# Patient Record
Sex: Female | Born: 1990
Health system: Southern US, Community
[De-identification: ages and names within clinical notes are randomized; demographics above are authoritative.]

## PROBLEM LIST (undated history)

## (undated) DIAGNOSIS — S060X9A Concussion with loss of consciousness of unspecified duration, initial encounter: Secondary | ICD-10-CM

## (undated) DIAGNOSIS — F988 Other specified behavioral and emotional disorders with onset usually occurring in childhood and adolescence: Secondary | ICD-10-CM

## (undated) HISTORY — DX: Other specified behavioral and emotional disorders with onset usually occurring in childhood and adolescence: F98.8

## (undated) HISTORY — PX: TARSAL TUNNEL RELEASE: SHX5042

---
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
## 2014-01-29 HISTORY — PX: FOOT SURGERY: SHX648

## 2014-01-29 HISTORY — PX: ANKLE SURGERY: SHX546

## 2014-06-24 ENCOUNTER — Telehealth: Payer: Self-pay | Admitting: *Deleted

## 2014-06-24 NOTE — Telephone Encounter (Signed)
Pt mother Britta Mccreedy would dr todd to accept her daughter as new pt. Can I sch?

## 2014-06-24 NOTE — Telephone Encounter (Signed)
Okay per Dr Tawanna Cooler.  Please remind patient that he will be retiring in March.

## 2014-06-25 NOTE — Telephone Encounter (Signed)
Pt aware and appt has been scheduled

## 2014-07-15 ENCOUNTER — Ambulatory Visit (INDEPENDENT_AMBULATORY_CARE_PROVIDER_SITE_OTHER): Payer: 59 | Admitting: Family Medicine

## 2014-07-15 ENCOUNTER — Other Ambulatory Visit: Payer: Self-pay | Admitting: *Deleted

## 2014-07-15 ENCOUNTER — Encounter: Payer: Self-pay | Admitting: Family Medicine

## 2014-07-15 VITALS — BP 120/80 | Temp 98.7°F | Ht 68.0 in | Wt 154.0 lb

## 2014-07-15 DIAGNOSIS — F900 Attention-deficit hyperactivity disorder, predominantly inattentive type: Secondary | ICD-10-CM

## 2014-07-15 DIAGNOSIS — F988 Other specified behavioral and emotional disorders with onset usually occurring in childhood and adolescence: Secondary | ICD-10-CM | POA: Insufficient documentation

## 2014-07-15 MED ORDER — LISDEXAMFETAMINE DIMESYLATE 20 MG PO CAPS: 20.0000 mg | ORAL_CAPSULE | Freq: Every day | ORAL | Status: DC

## 2014-07-15 NOTE — Patient Instructions (Signed)
Findings 20 mg........Marland Kitchen. 1 daily in the morning  Return in 3-4 weeks for followup

## 2014-07-15 NOTE — Progress Notes (Signed)
   Subjective:    Patient ID: Carol Smith, female    DOB: 11-12-1990, 23 y.o.   MRN: [id number]  HPI Carol Smith is a 23 year old single female nonsmoker who comes in today as an inpatient   I have seen in taking care of her mom and dad in the past  She was first diagnosed to have ADD when she was 23 years of age by her pediatrician. She underwent a number Dison Costen-Kerrison psychologic testing. She was seen and followed through her younger in teenagers by pediatrics. 3 years ago she a stat pediatrics and stopped her medication. Since that time she's been going to PT cc. She hopes to transfer to the see you.  She's never been hospitalized she has no major illnesses or injuries none allergies and she takes her medication on a daily basis. Except for minocycline 100 twice a day for acne. Birth control none indicated she states she's not sexually active   Review of Systems    review of systems otherwise negative Objective:   Physical Exam Well-developed well-nourished female no acute distress vital signs stable she is afebrile       Assessment & Plan:  Adult ADD without hyperactivity.......... trial vyvanse

## 2014-08-09 ENCOUNTER — Ambulatory Visit (INDEPENDENT_AMBULATORY_CARE_PROVIDER_SITE_OTHER): Payer: 59 | Admitting: Family Medicine

## 2014-08-09 ENCOUNTER — Encounter: Payer: Self-pay | Admitting: Family Medicine

## 2014-08-09 VITALS — BP 110/80 | Temp 98.5°F | Wt 150.5 lb

## 2014-08-09 DIAGNOSIS — F900 Attention-deficit hyperactivity disorder, predominantly inattentive type: Secondary | ICD-10-CM

## 2014-08-09 DIAGNOSIS — F988 Other specified behavioral and emotional disorders with onset usually occurring in childhood and adolescence: Secondary | ICD-10-CM

## 2014-08-09 MED ORDER — METHYLPHENIDATE HCL 5 MG PO TABS: ORAL_TABLET | ORAL | Status: DC

## 2014-08-09 MED ORDER — METHYLPHENIDATE HCL 5 MG PO TABS: 5.0000 mg | ORAL_TABLET | Freq: Two times a day (BID) | ORAL | Status: DC

## 2014-08-09 MED ORDER — LISDEXAMFETAMINE DIMESYLATE 20 MG PO CAPS: 20.0000 mg | ORAL_CAPSULE | Freq: Every day | ORAL | Status: DC

## 2014-08-09 NOTE — Patient Instructions (Signed)
Vyvanse 20 mg........... One in the morning as you're currently doing  Ritalin 5 mg..........Marland Kitchen. 1 at 5 PM when necessary  Call 2 weeks from being out of your third prescription for refills

## 2014-08-09 NOTE — Progress Notes (Signed)
Pre visit review using our clinic review tool, if applicable. No additional management support is needed unless otherwise documented below in the visit note. 

## 2014-08-09 NOTE — Progress Notes (Signed)
   Subjective:    Patient ID: Carol Smith, female    DOB: July 25, 1991, 23 y.o.   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 She takes at 9 AM and last about 6 PM. She typically has a lot of homework to do at night and would like to talk about increasing the dose of the morning medication. However I suggested we use 5 mg supplement when necessary   Review of Systems Review of systems negative side effects from medication    Objective:   Physical Exam  Well-developed well-nourished female no acute distress vital signs stable she is afebrile      Assessment & Plan:  Adult ADD,,,,,,,,,, continue the Vyvanse 20 mg daily,,,,,,,,, add regular Ritalin 5 mg at 6 PM when necessary

## 2015-05-17 ENCOUNTER — Telehealth: Payer: Self-pay | Admitting: Family Medicine

## 2015-05-17 NOTE — Telephone Encounter (Signed)
[facility] - Client TELEPHONE ADVICE RECORD [facility] Patient Name: Carol Smith DOB: October 23, 1990 Initial Comment caller states she has an earache Nurse Assessment Nurse: Lane Hacker, RN, Windy Date/Time Lamount Cohen Time): 05/17/2015 12:20:26 PM Confirm and document reason for call. If symptomatic, describe symptoms. ---Caller states that she has had bilateral earache pain since last Wednesday, and worse each day. Rates pain now at 4/10 after DayQuil. She also has cold s/s with this - stuffy nose and mild dry cough. Had a fever, but not now. Has the patient traveled out of the country within the last 30 days? ---Not Applicable Does the patient require triage? ---Yes Related visit to physician within the last 2 weeks? ---No Does the PT have any chronic conditions? (i.e. diabetes, asthma, etc.) ---No Did the patient indicate they were pregnant? ---No Guidelines Guideline Title Affirmed Question Affirmed Notes Earache Earache (Exceptions: brief ear pain of < 60 minutes duration, earache occurring during air travel Final Disposition User See Physician within [address] Hours [place], [place], [place] Comments Dr. Tawanna Cooler is OOT. RN made appt for tomorrow at 10 am with Dr. Marshia Ly. Caller aware. Referrals REFERRED TO PCP OFFICE Disagree/Comply: Comply

## 2015-05-18 ENCOUNTER — Encounter: Payer: Self-pay | Admitting: Internal Medicine

## 2015-05-18 ENCOUNTER — Ambulatory Visit (INDEPENDENT_AMBULATORY_CARE_PROVIDER_SITE_OTHER): Payer: 59 | Admitting: Internal Medicine

## 2015-05-18 VITALS — BP 100/70 | HR 88 | Temp 99.2°F | Resp 18 | Ht 68.0 in | Wt 136.0 lb

## 2015-05-18 DIAGNOSIS — J069 Acute upper respiratory infection, unspecified: Secondary | ICD-10-CM

## 2015-05-18 NOTE — Progress Notes (Signed)
Pre visit review using our clinic review tool, if applicable. No additional management support is needed unless otherwise documented below in the visit note. 

## 2015-05-18 NOTE — Progress Notes (Signed)
Subjective:    Patient ID: Carol Smith, female    DOB: [birth year], 24 y.o.   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  For the past week she has had some bilateral ear discomfort and sinus congestion.  Over the past 4-5 days.  She is about some cold symptoms with cough, mild sore throat, low-grade fever.  She has been using DayQuil.  No past medical history on file.  Social History   Social History  . Marital Status: Single    Spouse Name: N/A  . Number of Children: N/A  . Years of Education: N/A   Occupational History  . Not on file.   Social History Main Topics  . Smoking status: Never Smoker   . Smokeless tobacco: Not on file  . Alcohol Use: Yes  . Drug Use: Not on file  . Sexual Activity: Not on file   Other Topics Concern  . Not on file   Social History Narrative    Past Surgical History  Procedure Laterality Date  . Foot surgery  01/2014  . Ankle surgery  01/2014  . Tarsal tunnel release Right     Family History  Problem Relation Age of Onset  . Cancer Other   . Hypertension Other     No Known Allergies  Current Outpatient Prescriptions on File Prior to Visit  Medication Sig Dispense Refill  . lisdexamfetamine (VYVANSE) 20 MG capsule Take 1 capsule (20 mg total) by mouth daily. 30 capsule 0  . lisdexamfetamine (VYVANSE) 20 MG capsule Take 1 capsule (20 mg total) by mouth daily. 30 capsule 0  . lisdexamfetamine (VYVANSE) 20 MG capsule Take 1 capsule (20 mg total) by mouth daily. 30 capsule 0  . methylphenidate (RITALIN) 5 MG tablet 1 at 5 PM 30 tablet 0  . methylphenidate (RITALIN) 5 MG tablet Take 1 tablet (5 mg total) by mouth 2 (two) times daily. 30 tablet 0  . methylphenidate (RITALIN) 5 MG tablet Take 1 tablet (5 mg total) by mouth 2 (two) times daily. 30 tablet 0   No current facility-administered medications on file prior to visit.    BP 100/70 mmHg  Pulse 88  Temp(Src) 99.2 F (37.3 C) (Oral)  Resp 18  Ht 5\' 8"   (1.727 m)  Wt 136 lb (61.689 kg)  BMI 20.68 kg/m2  SpO2 98%     Review of Systems  Constitutional: Positive for fever, activity change, appetite change and fatigue.  HENT: Positive for congestion, ear pain and sinus pressure. Negative for dental problem, ear discharge, hearing loss, rhinorrhea, sore throat and tinnitus.   Eyes: Negative for pain, discharge and visual disturbance.  Respiratory: Positive for cough. Negative for shortness of breath.   Cardiovascular: Negative for chest pain, palpitations and leg swelling.  Gastrointestinal: Negative for nausea, vomiting, abdominal pain, diarrhea, constipation, blood in stool and abdominal distention.  Genitourinary: Negative for dysuria, urgency, frequency, hematuria, flank pain, vaginal bleeding, vaginal discharge, difficulty urinating, vaginal pain and pelvic pain.  Musculoskeletal: Negative for joint swelling, arthralgias and gait problem.  Skin: Negative for rash.  Neurological: Negative for dizziness, syncope, speech difficulty, weakness, numbness and headaches.  Hematological: Negative for adenopathy.  Psychiatric/Behavioral: Negative for behavioral problems, dysphoric mood and agitation. The patient is not nervous/anxious.        Objective:   Physical Exam  Constitutional: She is oriented to person, place, and time. She appears well-developed and well-nourished.  HENT:  Head: Normocephalic.  Right Ear: External  ear normal.  Left Ear: External ear normal.  Mild erythema of the oropharynx Both tympanic membranes were normal  Eyes: Conjunctivae and EOM are normal. Pupils are equal, round, and reactive to light.  Neck: Normal range of motion. Neck supple. No thyromegaly present.  Cardiovascular: Normal rate, regular rhythm, normal heart sounds and intact distal pulses.   Pulmonary/Chest: Effort normal and breath sounds normal.  Abdominal: Soft. Bowel sounds are normal. She exhibits no mass. There is no tenderness.    Musculoskeletal: Normal range of motion.  Lymphadenopathy:    She has no cervical adenopathy.  Neurological: She is alert and oriented to person, place, and time.  Skin: Skin is warm and dry. No rash noted.  Psychiatric: She has a normal mood and affect. Her behavior is normal.          Assessment & Plan:   Viral URI.  Will treat symptomatically.  Patient will report any new or worsening symptoms ADHD stable

## 2015-05-18 NOTE — Patient Instructions (Signed)
Use saline irrigation, warm  moist compresses and over-the-counter decongestants only as directed.  Call if there is no improvement in 5 to 7 days, or sooner if you develop increasing pain, fever, or any new symptoms.  HOME CARE INSTRUCTIONS  Drink plenty of water. Water helps thin the mucus so your sinuses can drain more easily.  Use a humidifier.  Inhale steam 3 to 4 times a day (for example, sit in the bathroom with the shower running).  Apply a warm, moist washcloth to your face 3 to 4 times a day, or as directed by your health care provider.  Use saline nasal sprays to help moisten and clean your sinuses.

## 2015-05-26 ENCOUNTER — Ambulatory Visit (INDEPENDENT_AMBULATORY_CARE_PROVIDER_SITE_OTHER): Payer: 59 | Admitting: Family Medicine

## 2015-05-26 ENCOUNTER — Encounter: Payer: Self-pay | Admitting: Family Medicine

## 2015-05-26 VITALS — BP 108/80 | HR 93 | Temp 98.6°F | Wt 135.0 lb

## 2015-05-26 DIAGNOSIS — H9202 Otalgia, left ear: Secondary | ICD-10-CM

## 2015-05-26 DIAGNOSIS — M2662 Arthralgia of temporomandibular joint: Secondary | ICD-10-CM | POA: Diagnosis not present

## 2015-05-26 DIAGNOSIS — M26629 Arthralgia of temporomandibular joint, unspecified side: Secondary | ICD-10-CM

## 2015-05-26 NOTE — Patient Instructions (Signed)
Actual ear drum looks great I think TMJD is likely cause of your pain Treat with aleve 1 pill twice a day for 7-10 days.  If no better in 2 weeks, return to see [place]

## 2015-05-26 NOTE — Progress Notes (Signed)
Tana Conch, MD  Subjective:  Carol Smith is a 24 y.o. year old very pleasant female patient who presents with:  Left ear pain -see about a week ago for viral URI as well as bilateral ear pain. Cold has slowly improved. Ear pain in right resolved, left ear pain has persisted. 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 Has tried a warm cloth which helps some. Pain is worse with chewing or if talking prolonged period. Known TMJD history- states was considered for mouth guard in past. No antiinflammatories have been tried.   ROS- no fever, chills, nausea, vomiting. Mild sore throat and drainage persist.   Past Medical History- ADD  Medications- reviewed and updated, vyvanse and ritalin per Dr. Tawanna Cooler  Objective: BP 108/80 mmHg  Pulse 93  Temp(Src) 98.6 F (37 C)  Wt 135 lb (61.236 kg) Gen: NAD, resting comfortably, well appearing TM normal bilaterally, pharynx very mild erythema, no tonsilar enlargement or exudate With jaw motion feel click bilaterally at TMJ- worsens pain CV: RRR no murmurs rubs or gallops Lungs: CTAB no crackles, wheeze, rhonchi Ext: no edema Skin: warm, dry Neuro: grossly normal, moves all extremities  Assessment/Plan:  TMJ arthralgia as likely cause of Left ear pain. URI improving. Left ear pain persists- worse with chewing or talking and palpable click on exam. We will trial aleve BID for 7-10 days. If persistent symptoms not improving, return in 2 weeks. Would consider ENT though suspect patient will improve.   Return precautions advised.

## 2015-10-11 ENCOUNTER — Encounter: Payer: Self-pay | Admitting: Family Medicine

## 2015-10-11 ENCOUNTER — Ambulatory Visit: Payer: 59 | Admitting: Family Medicine

## 2015-10-11 ENCOUNTER — Ambulatory Visit (INDEPENDENT_AMBULATORY_CARE_PROVIDER_SITE_OTHER): Payer: 59 | Admitting: Family Medicine

## 2015-10-11 VITALS — BP 110/70 | Temp 98.7°F | Wt 145.0 lb

## 2015-10-11 DIAGNOSIS — M79605 Pain in left leg: Secondary | ICD-10-CM

## 2015-10-11 DIAGNOSIS — M545 Low back pain, unspecified: Secondary | ICD-10-CM

## 2015-10-11 MED ORDER — TRAMADOL HCL 50 MG PO TABS: ORAL_TABLET | ORAL | Status: DC

## 2015-10-11 MED ORDER — CYCLOBENZAPRINE HCL 5 MG PO TABS: ORAL_TABLET | ORAL | Status: DC

## 2015-10-11 NOTE — Progress Notes (Signed)
Pre visit review using our clinic review tool, if applicable. No additional management support is needed unless otherwise documented below in the visit note. 

## 2015-10-11 NOTE — Patient Instructions (Signed)
Tramadol 50 mg............ one twice daily  Flexeril 5 mg.........Marland Kitchen. 1 at bedtime  Avoid sitting  We will set you up a physical therapy for evaluation and treatment program to relieve your back pain  Return when necessary
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
## 2016-04-18 ENCOUNTER — Encounter (HOSPITAL_COMMUNITY): Payer: Self-pay | Admitting: *Deleted

## 2016-04-18 ENCOUNTER — Emergency Department (HOSPITAL_COMMUNITY): Payer: BLUE CROSS/BLUE SHIELD

## 2016-04-18 ENCOUNTER — Emergency Department (HOSPITAL_COMMUNITY)
Admission: EM | Admit: 2016-04-18 | Discharge: 2016-04-18 | Disposition: A | Discharge status: Home / Self Care | Payer: BLUE CROSS/BLUE SHIELD | Attending: Emergency Medicine | Admitting: Emergency Medicine

## 2016-04-18 ENCOUNTER — Telehealth: Payer: Self-pay | Admitting: Family Medicine

## 2016-04-18 DIAGNOSIS — R55 Syncope and collapse: Secondary | ICD-10-CM | POA: Diagnosis not present

## 2016-04-18 DIAGNOSIS — M79671 Pain in right foot: Secondary | ICD-10-CM | POA: Diagnosis not present

## 2016-04-18 DIAGNOSIS — Y999 Unspecified external cause status: Secondary | ICD-10-CM | POA: Diagnosis not present

## 2016-04-18 DIAGNOSIS — Y939 Activity, unspecified: Secondary | ICD-10-CM | POA: Insufficient documentation

## 2016-04-18 DIAGNOSIS — N3000 Acute cystitis without hematuria: Secondary | ICD-10-CM | POA: Diagnosis not present

## 2016-04-18 DIAGNOSIS — S0990XA Unspecified injury of head, initial encounter: Secondary | ICD-10-CM | POA: Insufficient documentation

## 2016-04-18 DIAGNOSIS — Y929 Unspecified place or not applicable: Secondary | ICD-10-CM | POA: Diagnosis not present

## 2016-04-18 DIAGNOSIS — M25571 Pain in right ankle and joints of right foot: Secondary | ICD-10-CM | POA: Diagnosis not present

## 2016-04-18 DIAGNOSIS — Z791 Long term (current) use of non-steroidal anti-inflammatories (NSAID): Secondary | ICD-10-CM | POA: Insufficient documentation

## 2016-04-18 DIAGNOSIS — W1800XA Striking against unspecified object with subsequent fall, initial encounter: Secondary | ICD-10-CM | POA: Diagnosis not present

## 2016-04-18 DIAGNOSIS — S99911A Unspecified injury of right ankle, initial encounter: Secondary | ICD-10-CM | POA: Diagnosis not present

## 2016-04-18 DIAGNOSIS — S99921A Unspecified injury of right foot, initial encounter: Secondary | ICD-10-CM | POA: Diagnosis not present

## 2016-04-18 DIAGNOSIS — W19XXXA Unspecified fall, initial encounter: Secondary | ICD-10-CM

## 2016-04-18 DIAGNOSIS — Z79899 Other long term (current) drug therapy: Secondary | ICD-10-CM | POA: Diagnosis not present

## 2016-04-18 DIAGNOSIS — R52 Pain, unspecified: Secondary | ICD-10-CM

## 2016-04-18 LAB — CBC WITH DIFFERENTIAL/PLATELET
BASOS ABS: 0 10*3/uL (ref 0.0–0.1)
Basophils Relative: 0 %
EOS ABS: 0 10*3/uL (ref 0.0–0.7)
EOS PCT: 0 %
HCT: 37.7 % (ref 36.0–46.0)
HEMOGLOBIN: 12.8 g/dL (ref 12.0–15.0)
LYMPHS ABS: 2 10*3/uL (ref 0.7–4.0)
LYMPHS PCT: 32 %
MCH: 31.8 pg (ref 26.0–34.0)
MCHC: 34 g/dL (ref 30.0–36.0)
MCV: 93.8 fL (ref 78.0–100.0)
Monocytes Absolute: 0.7 10*3/uL (ref 0.1–1.0)
Monocytes Relative: 11 %
NEUTROS PCT: 57 %
Neutro Abs: 3.6 10*3/uL (ref 1.7–7.7)
PLATELETS: 243 10*3/uL (ref 150–400)
RBC: 4.02 MIL/uL (ref 3.87–5.11)
RDW: 12.9 % (ref 11.5–15.5)
WBC: 6.3 10*3/uL (ref 4.0–10.5)

## 2016-04-18 LAB — URINALYSIS, ROUTINE W REFLEX MICROSCOPIC
Bilirubin Urine: NEGATIVE
Glucose, UA: NEGATIVE mg/dL
Ketones, ur: >80 mg/dL — AB
Leukocytes, UA: NEGATIVE
NITRITE: NEGATIVE
PROTEIN: NEGATIVE mg/dL
SPECIFIC GRAVITY, URINE: 1.033 — AB (ref 1.005–1.030)
pH: 6 (ref 5.0–8.0)

## 2016-04-18 LAB — COMPREHENSIVE METABOLIC PANEL
ALT: 20 U/L (ref 14–54)
ANION GAP: 7 (ref 5–15)
AST: 27 U/L (ref 15–41)
Albumin: 4.7 g/dL (ref 3.5–5.0)
Alkaline Phosphatase: 40 U/L (ref 38–126)
BUN: 10 mg/dL (ref 6–20)
CHLORIDE: 106 mmol/L (ref 101–111)
CO2: 25 mmol/L (ref 22–32)
CREATININE: 0.82 mg/dL (ref 0.44–1.00)
Calcium: 9.3 mg/dL (ref 8.9–10.3)
Glucose, Bld: 98 mg/dL (ref 65–99)
POTASSIUM: 3.8 mmol/L (ref 3.5–5.1)
SODIUM: 138 mmol/L (ref 135–145)
Total Bilirubin: 1.2 mg/dL (ref 0.3–1.2)
Total Protein: 7.1 g/dL (ref 6.5–8.1)

## 2016-04-18 LAB — URINE MICROSCOPIC-ADD ON

## 2016-04-18 LAB — RAPID URINE DRUG SCREEN, HOSP PERFORMED
Amphetamines: NOT DETECTED
BARBITURATES: NOT DETECTED
Benzodiazepines: NOT DETECTED
Cocaine: NOT DETECTED
Opiates: NOT DETECTED
Tetrahydrocannabinol: NOT DETECTED

## 2016-04-18 LAB — I-STAT BETA HCG BLOOD, ED (MC, WL, AP ONLY)

## 2016-04-18 LAB — I-STAT TROPONIN, ED: TROPONIN I, POC: 0 ng/mL (ref 0.00–0.08)

## 2016-04-18 LAB — CBG MONITORING, ED: GLUCOSE-CAPILLARY: 84 mg/dL (ref 65–99)

## 2016-04-18 LAB — ETHANOL

## 2016-04-18 MED ORDER — CEPHALEXIN 500 MG PO CAPS: 500.0000 mg | ORAL_CAPSULE | Freq: Four times a day (QID) | ORAL | Status: DC

## 2016-04-18 MED ORDER — IBUPROFEN 200 MG PO TABS
600.0000 mg | ORAL_TABLET | Freq: Once | ORAL | Status: AC
  Administered 2016-04-18: 600 mg via ORAL
  Filled 2016-04-18: qty 3

## 2016-04-18 MED ORDER — ONDANSETRON HCL 4 MG/2ML IJ SOLN
4.0000 mg | Freq: Once | INTRAMUSCULAR | Status: AC
  Administered 2016-04-18: 4 mg via INTRAVENOUS
  Filled 2016-04-18: qty 2

## 2016-04-18 MED ORDER — NAPROXEN 500 MG PO TABS: 500.0000 mg | ORAL_TABLET | Freq: Two times a day (BID) | ORAL | Status: DC

## 2016-04-18 MED ORDER — SODIUM CHLORIDE 0.9 % IV BOLUS (SEPSIS)
1000.0000 mL | Freq: Once | INTRAVENOUS | Status: AC
  Administered 2016-04-18: 1000 mL via INTRAVENOUS

## 2016-04-18 MED ORDER — SODIUM CHLORIDE 0.9 % IV BOLUS (SEPSIS): 1000.0000 mL | Freq: Once | INTRAVENOUS | Status: DC

## 2016-04-18 MED ORDER — ONDANSETRON 4 MG PO TBDP: 4.0000 mg | ORAL_TABLET | Freq: Three times a day (TID) | ORAL | Status: DC | PRN

## 2016-04-18 NOTE — ED Notes (Signed)
Vonna KotykJay from lab, error in micro  Mucus present 6-30 RBC 0-5 WBC 0-5 Epithelial Rare bacteria  Primary nurse made aware of same.

## 2016-04-18 NOTE — Telephone Encounter (Signed)
Patient checked into ED 

## 2016-04-18 NOTE — Telephone Encounter (Signed)
[facility] - Client TELEPHONE ADVICE RECORD [facility] Patient Name: Carol Smith DOB: [birth year] Initial Comment caller states daughter fell and hit her head last night, doesn't remember any of it, fatigued and nauseated Nurse Assessment Nurse: Debera Latalston, RN, Tinnie GensJeffrey Date/Time Lamount Cohen(Eastern Time): 04/18/2016 11:19:12 AM Confirm and document reason for call. If symptomatic, describe symptoms. You must click the next button to save text entered. ---Caller states daughter fell and hit her head last night, doesn't remember any of it, fatigued and nauseated. Has the patient traveled out of the country within the last 30 days? ---No Does the patient have any new or worsening symptoms? ---Yes Will a triage be completed? ---Yes Related visit to physician within the last 2 weeks? ---N/A Does the PT have any chronic conditions? (i.e. diabetes, asthma, etc.) ---No Is the patient pregnant or possibly pregnant? (Ask all females between the ages of 1512-55) ---No Is this a behavioral health or substance abuse call? ---No Guidelines Guideline Title Affirmed Question Affirmed Notes Head Injury Can't remember what happened (amnesia) Final Disposition User Go to ED Now Debera Latalston, RN, Tinnie GensJeffrey Referrals GO TO FACILITY UNDECIDED Disagree/Comply: Danella Maiersomply

## 2016-04-18 NOTE — ED Notes (Signed)
Pt was put on a bedpan but was unable to provide a urine sample at this time.  Will ask again later.

## 2016-04-18 NOTE — Discharge Instructions (Signed)
You have been seen today for a fall and a head injury. Your imaging showed no acute abnormalities.   1. Head injury: Follow up with PCP in a week for reassessment to ensure proper symptom resolution. Return to ED should symptoms worsen. Refer to the attached literature for more specific information, to include expected symptoms as well as symptoms that would require you to return to the ED. May use ibuprofen, naproxen, or Tylenol for pain. Close observation is indicated by a responsible friend or family member for 24 hours from the injury. 2. UTI: There were signs of a urinary tract infection on the urinalysis. Please take all of your antibiotics until finished!   You may develop abdominal discomfort or diarrhea from the antibiotic.  You may help offset this with probiotics which you can buy or get in yogurt. Do not eat or take the probiotics until 2 hours after your antibiotic.

## 2016-04-18 NOTE — ED Notes (Signed)
Pt not in the room at this time.  PA talking to family member.
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
## 2016-04-18 NOTE — ED Notes (Signed)
Pt in CT.

## 2016-04-18 NOTE — ED Notes (Signed)
Sprite given for fluid challenge.  PA-C in to room.

## 2016-04-18 NOTE — ED Notes (Signed)
During ambulation pt complains of being lightheaded and sore.

## 2016-04-18 NOTE — ED Notes (Signed)
Pt's mother reports pt received a call early this am around 0200 about pt falling and hitting her forehead.  Pt reports she was in an elevator, thinks her R foot got caught in the "elevator gap" and fell-face forward hitting her forehead on a tile floor.  Pt does not recall what happened after the fall, reports possible LOC.  Pt reports pain in her forehead and her R foot.  +weight bearing to R foot.

## 2016-04-26 ENCOUNTER — Encounter: Payer: Self-pay | Admitting: Family Medicine

## 2016-04-26 ENCOUNTER — Ambulatory Visit (INDEPENDENT_AMBULATORY_CARE_PROVIDER_SITE_OTHER): Payer: BLUE CROSS/BLUE SHIELD | Admitting: Family Medicine

## 2016-04-26 VITALS — BP 100/72 | HR 95 | Temp 98.7°F | Ht 68.0 in | Wt 139.3 lb

## 2016-04-26 DIAGNOSIS — S060X9A Concussion with loss of consciousness of unspecified duration, initial encounter: Secondary | ICD-10-CM

## 2016-04-26 DIAGNOSIS — Z789 Other specified health status: Secondary | ICD-10-CM | POA: Diagnosis not present

## 2016-04-26 DIAGNOSIS — W19XXXA Unspecified fall, initial encounter: Secondary | ICD-10-CM | POA: Diagnosis not present

## 2016-04-26 DIAGNOSIS — N390 Urinary tract infection, site not specified: Secondary | ICD-10-CM | POA: Diagnosis not present

## 2016-04-26 DIAGNOSIS — Z7289 Other problems related to lifestyle: Secondary | ICD-10-CM

## 2016-04-26 NOTE — Patient Instructions (Signed)
Schedule a physical exam with your doctor.   Concussion, Adult A concussion, or closed-head injury, is a brain injury caused by a direct blow to the head or by a quick and sudden movement (jolt) of the head or neck. Concussions are usually not life-threatening. Even so, the effects of a concussion can be serious. If you have had a concussion before, you are more likely to experience concussion-like symptoms after a direct blow to the head.  CAUSES  Direct blow to the head, such as from running into another player during a soccer game, being hit in a fight, or hitting your head on a hard surface.  A jolt of the head or neck that causes the brain to move back and forth inside the skull, such as in a car crash. SIGNS AND SYMPTOMS The signs of a concussion can be hard to notice. Early on, they may be missed by you, family members, and health care providers. You may look fine but act or feel differently. Symptoms are usually temporary, but they may last for days, weeks, or even longer. Some symptoms may appear right away while others may not show up for hours or days. Every head injury is different. Symptoms include:  Mild to moderate headaches that will not go away.  A feeling of pressure inside your head.  Having more trouble than usual:  Learning or remembering things you have heard.  Answering questions.  Paying attention or concentrating.  Organizing daily tasks.  Making decisions and solving problems.  Slowness in thinking, acting or reacting, speaking, or reading.  Getting lost or being easily confused.  Feeling tired all the time or lacking energy (fatigued).  Feeling drowsy.  Sleep disturbances.  Sleeping more than usual.  Sleeping less than usual.  Trouble falling asleep.  Trouble sleeping (insomnia).  Loss of balance or feeling lightheaded or dizzy.  Nausea or vomiting.  Numbness or tingling.  Increased sensitivity  to:  Sounds.  Lights.  Distractions.  Vision problems or eyes that tire easily.  Diminished sense of taste or smell.  Ringing in the ears.  Mood changes such as feeling sad or anxious.  Becoming easily irritated or angry for little or no reason.  Lack of motivation.  Seeing or hearing things other people do not see or hear (hallucinations). DIAGNOSIS Your health care provider can usually diagnose a concussion based on a description of your injury and symptoms. He or she will ask whether you passed out (lost consciousness) and whether you are having trouble remembering events that happened right before and during your injury. Your evaluation might include:  A brain scan to look for signs of injury to the brain. Even if the test shows no injury, you may still have a concussion.  Blood tests to be sure other problems are not present. TREATMENT  Concussions are usually treated in an emergency department, in urgent care, or at a clinic. You may need to stay in the hospital overnight for further treatment.  Tell your health care provider if you are taking any medicines, including prescription medicines, over-the-counter medicines, and natural remedies. Some medicines, such as blood thinners (anticoagulants) and aspirin, may increase the chance of complications. Also tell your health care provider whether you have had alcohol or are taking illegal drugs. This information may affect treatment.  Your health care provider will send you home with important instructions to follow.  How fast you will recover from a concussion depends on many factors. These factors include how severe your  concussion is, what part of your brain was injured, your age, and how healthy you were before the concussion.  Most people with mild injuries recover fully. Recovery can take time. In general, recovery is slower in older persons. Also, persons who have had a concussion in the past or have other medical  problems may find that it takes longer to recover from their current injury. HOME CARE INSTRUCTIONS General Instructions  Carefully follow the directions your health care provider gave you.  Only take over-the-counter or prescription medicines for pain, discomfort, or fever as directed by your health care provider.  Take only those medicines that your health care provider has approved.  Do not drink alcohol until your health care provider says you are well enough to do so. Alcohol and certain other drugs may slow your recovery and can put you at risk of further injury.  If it is harder than usual to remember things, write them down.  If you are easily distracted, try to do one thing at a time. For example, do not try to watch TV while fixing dinner.  Talk with family members or close friends when making important decisions.  Keep all follow-up appointments. Repeated evaluation of your symptoms is recommended for your recovery.  Watch your symptoms and tell others to do the same. Complications sometimes occur after a concussion. Older adults with a brain injury may have a higher risk of serious complications, such as a blood clot on the brain.  Tell your teachers, school nurse, school counselor, coach, athletic trainer, or work Freight forwarder about your injury, symptoms, and restrictions. Tell them about what you can or cannot do. They should watch for:  Increased problems with attention or concentration.  Increased difficulty remembering or learning new information.  Increased time needed to complete tasks or assignments.  Increased irritability or decreased ability to cope with stress.  Increased symptoms.  Rest. Rest helps the brain to heal. Make sure you:  Get plenty of sleep at night. Avoid staying up late at night.  Keep the same bedtime hours on weekends and weekdays.  Rest during the day. Take daytime naps or rest breaks when you feel tired.  Limit activities that require a  lot of thought or concentration. These include:  Doing homework or job-related work.  Watching TV.  Working on the computer.  Avoid any situation where there is potential for another head injury (football, hockey, soccer, basketball, martial arts, downhill snow sports and horseback riding). Your condition will get worse every time you experience a concussion. You should avoid these activities until you are evaluated by the appropriate follow-up health care providers. Returning To Your Regular Activities You will need to return to your normal activities slowly, not all at once. You must give your body and brain enough time for recovery.  Do not return to sports or other athletic activities until your health care provider tells you it is safe to do so.  Ask your health care provider when you can drive, ride a bicycle, or operate heavy machinery. Your ability to react may be slower after a brain injury. Never do these activities if you are dizzy.  Ask your health care provider about when you can return to work or school. Preventing Another Concussion It is very important to avoid another brain injury, especially before you have recovered. In rare cases, another injury can lead to permanent brain damage, brain swelling, or death. The risk of this is greatest during the first 7-10 days after  a head injury. Avoid injuries by:  Wearing a seat belt when riding in a car.  Drinking alcohol only in moderation.  Wearing a helmet when biking, skiing, skateboarding, skating, or doing similar activities.  Avoiding activities that could lead to a second concussion, such as contact or recreational sports, until your health care provider says it is okay.  Taking safety measures in your home.  Remove clutter and tripping hazards from floors and stairways.  Use grab bars in bathrooms and handrails by stairs.  Place non-slip mats on floors and in bathtubs.  Improve lighting in dim areas. SEEK MEDICAL  CARE IF:  You have increased problems paying attention or concentrating.  You have increased difficulty remembering or learning new information.  You need more time to complete tasks or assignments than before.  You have increased irritability or decreased ability to cope with stress.  You have more symptoms than before. Seek medical care if you have any of the following symptoms for more than 2 weeks after your injury:  Lasting (chronic) headaches.  Dizziness or balance problems.  Nausea.  Vision problems.  Increased sensitivity to noise or light.  Depression or mood swings.  Anxiety or irritability.  Memory problems.  Difficulty concentrating or paying attention.  Sleep problems.  Feeling tired all the time. SEEK IMMEDIATE MEDICAL CARE IF:  You have severe or worsening headaches. These may be a sign of a blood clot in the brain.  You have weakness (even if only in one hand, leg, or part of the face).  You have numbness.  You have decreased coordination.  You vomit repeatedly.  You have increased sleepiness.  One pupil is larger than the other.  You have convulsions.  You have slurred speech.  You have increased confusion. This may be a sign of a blood clot in the brain.  You have increased restlessness, agitation, or irritability.  You are unable to recognize people or places.  You have neck pain.  It is difficult to wake you up.  You have unusual behavior changes.  You lose consciousness. MAKE SURE YOU:  Understand these instructions.  Will watch your condition.  Will get help right away if you are not doing well or get worse.   This information is not intended to replace advice given to you by your health care provider. Make sure you discuss any questions you have with your health care provider.   Document Released: 12/08/2003 Document Revised: 10/08/2014 Document Reviewed: 04/09/2013 Elsevier Interactive Patient Education [employer].

## 2016-04-26 NOTE — Progress Notes (Signed)
HPI:  Carol Smith is a pleasant 25 yo here for follow up after emergency room visit for a fall with head injury. She was using alcohol and fell and hit head with brief LOC and suffered cuncussion. She was examined thoroughly in the ER on 7/19 with DR head, C-spine CT, foot xrays, EKG and labs. She had a UTI and was treated for this. She reports she is doing. Feels back to normal now. No HA, neck pain, dizziness, vision changes, urinary symptoms, nausea, vomiting or other symptoms. Does no play sports. She had 4 beers prior to the incident, but rarely drinks. Reports she caught her foot in the crack in the elevator causing her to fall. Reports has always been clumsy.  ROS: See pertinent positives and negatives per HPI.  No past medical history on file.  Past Surgical History:  Procedure Laterality Date  . ANKLE SURGERY  01/2014  . FOOT SURGERY  01/2014  . TARSAL TUNNEL RELEASE Right     Family History  Problem Relation Age of Onset  . Cancer Other   . Hypertension Other     Social History   Social History  . Marital status: Single    Spouse name: N/A  . Number of children: N/A  . Years of education: N/A   Social History Main Topics  . Smoking status: Never Smoker  . Smokeless tobacco: None  . Alcohol use Yes  . Drug use: No  . Sexual activity: Not Asked   Other Topics Concern  . None   Social History Narrative  . None     Current Outpatient Prescriptions:  .  cephALEXin (KEFLEX) 500 MG capsule, Take 1 capsule (500 mg total) by mouth 4 (four) times daily., Disp: 20 capsule, Rfl: 0 .  ibuprofen (ADVIL,MOTRIN) 200 MG tablet, Take 400 mg by mouth every 6 (six) hours as needed for fever, headache, mild pain, moderate pain or cramping., Disp: , Rfl:  .  Ibuprofen (MIDOL) 200 MG CAPS, Take 2 capsules by mouth 2 (two) times daily as needed (for pain)., Disp: , Rfl:  .  Multiple Vitamin (MULTIVITAMIN WITH MINERALS) TABS tablet, Take 1 tablet by mouth daily., Disp: , Rfl:  .   naproxen (NAPROSYN) 500 MG tablet, Take 1 tablet (500 mg total) by mouth 2 (two) times daily., Disp: 30 tablet, Rfl: 0 .  ondansetron (ZOFRAN ODT) 4 MG disintegrating tablet, Take 1 tablet (4 mg total) by mouth every 8 (eight) hours as needed for nausea or vomiting., Disp: 20 tablet, Rfl: 0  EXAM:  Vitals:   04/26/16 1441  BP: 100/72  Pulse: 95  Temp: 98.7 F (37.1 C)    Body mass index is 21.18 kg/m.  GENERAL: vitals reviewed and listed above, alert, oriented, appears well hydrated and in no acute distress  HEENT: atraumatic, conjunttiva clear, no obvious abnormalities on inspection of external nose and ears  NECK: no obvious masses on inspection  LUNGS: clear to auscultation bilaterally, no wheezes, rales or rhonchi, good air movement  CV: HRRR, no peripheral edema  MS: moves all extremities without noticeable abnormality  PSYCH/Neuro: pleasant and cooperative, no obvious depression or anxiety, CN II-XII grossly intact, finger to nose normal, gait normal - did not test balance as she reports has always had balance issues - hx remote ankle surgeries and has never been able to stand on 1 foot since. Steady tracking and no dizziness or symptoms with repetitive lateral or horizontal eye movements.  ASSESSMENT AND PLAN:  Discussed the  following assessment and plan:  Concussion with loss of consciousness, unspecified duration, initial encounter  Urinary tract infection without hematuria, site unspecified  Fall, initial encounter  Alcohol use (HCC)  -counseled on implications, treatment, course of concussions -advised caution and prevention falls -advised no more then 2 drinks in 24 hours, no more then 7 in a week and risks of excessive alcohol use -advised to schedule physical exam -Patient advised to return or notify a doctor immediately if symptoms worsen or persist or new concerns arise.  Patient Instructions  Schedule a physical exam with your doctor.   Concussion,  Adult A concussion, or closed-head injury, is a brain injury caused by a direct blow to the head or by a quick and sudden movement (jolt) of the head or neck. Concussions are usually not life-threatening. Even so, the effects of a concussion can be serious. If you have had a concussion before, you are more likely to experience concussion-like symptoms after a direct blow to the head.  CAUSES  Direct blow to the head, such as from running into another player during a soccer game, being hit in a fight, or hitting your head on a hard surface.  A jolt of the head or neck that causes the brain to move back and forth inside the skull, such as in a car crash. SIGNS AND SYMPTOMS The signs of a concussion can be hard to notice. Early on, they may be missed by you, family members, and health care providers. You may look fine but act or feel differently. Symptoms are usually temporary, but they may last for days, weeks, or even longer. Some symptoms may appear right away while others may not show up for hours or days. Every head injury is different. Symptoms include:  Mild to moderate headaches that will not go away.  A feeling of pressure inside your head.  Having more trouble than usual:  Learning or remembering things you have heard.  Answering questions.  Paying attention or concentrating.  Organizing daily tasks.  Making decisions and solving problems.  Slowness in thinking, acting or reacting, speaking, or reading.  Getting lost or being easily confused.  Feeling tired all the time or lacking energy (fatigued).  Feeling drowsy.  Sleep disturbances.  Sleeping more than usual.  Sleeping less than usual.  Trouble falling asleep.  Trouble sleeping (insomnia).  Loss of balance or feeling lightheaded or dizzy.  Nausea or vomiting.  Numbness or tingling.  Increased sensitivity to:  Sounds.  Lights.  Distractions.  Vision problems or eyes that tire easily.  Diminished  sense of taste or smell.  Ringing in the ears.  Mood changes such as feeling sad or anxious.  Becoming easily irritated or angry for little or no reason.  Lack of motivation.  Seeing or hearing things other people do not see or hear (hallucinations). DIAGNOSIS Your health care provider can usually diagnose a concussion based on a description of your injury and symptoms. He or she will ask whether you passed out (lost consciousness) and whether you are having trouble remembering events that happened right before and during your injury. Your evaluation might include:  A brain scan to look for signs of injury to the brain. Even if the test shows no injury, you may still have a concussion.  Blood tests to be sure other problems are not present. TREATMENT  Concussions are usually treated in an emergency department, in urgent care, or at a clinic. You may need to stay in the hospital  overnight for further treatment.  Tell your health care provider if you are taking any medicines, including prescription medicines, over-the-counter medicines, and natural remedies. Some medicines, such as blood thinners (anticoagulants) and aspirin, may increase the chance of complications. Also tell your health care provider whether you have had alcohol or are taking illegal drugs. This information may affect treatment.  Your health care provider will send you home with important instructions to follow.  How fast you will recover from a concussion depends on many factors. These factors include how severe your concussion is, what part of your brain was injured, your age, and how healthy you were before the concussion.  Most people with mild injuries recover fully. Recovery can take time. In general, recovery is slower in older persons. Also, persons who have had a concussion in the past or have other medical problems may find that it takes longer to recover from their current injury. HOME CARE  INSTRUCTIONS General Instructions  Carefully follow the directions your health care provider gave you.  Only take over-the-counter or prescription medicines for pain, discomfort, or fever as directed by your health care provider.  Take only those medicines that your health care provider has approved.  Do not drink alcohol until your health care provider says you are well enough to do so. Alcohol and certain other drugs may slow your recovery and can put you at risk of further injury.  If it is harder than usual to remember things, write them down.  If you are easily distracted, try to do one thing at a time. For example, do not try to watch TV while fixing dinner.  Talk with family members or close friends when making important decisions.  Keep all follow-up appointments. Repeated evaluation of your symptoms is recommended for your recovery.  Watch your symptoms and tell others to do the same. Complications sometimes occur after a concussion. Older adults with a brain injury may have a higher risk of serious complications, such as a blood clot on the brain.  Tell your teachers, school nurse, school counselor, coach, athletic trainer, or work Production designer, theatre/television/film about your injury, symptoms, and restrictions. Tell them about what you can or cannot do. They should watch for:  Increased problems with attention or concentration.  Increased difficulty remembering or learning new information.  Increased time needed to complete tasks or assignments.  Increased irritability or decreased ability to cope with stress.  Increased symptoms.  Rest. Rest helps the brain to heal. Make sure you:  Get plenty of sleep at night. Avoid staying up late at night.  Keep the same bedtime hours on weekends and weekdays.  Rest during the day. Take daytime naps or rest breaks when you feel tired.  Limit activities that require a lot of thought or concentration. These include:  Doing homework or job-related  work.  Watching TV.  Working on the computer.  Avoid any situation where there is potential for another head injury (football, hockey, soccer, basketball, martial arts, downhill snow sports and horseback riding). Your condition will get worse every time you experience a concussion. You should avoid these activities until you are evaluated by the appropriate follow-up health care providers. Returning To Your Regular Activities You will need to return to your normal activities slowly, not all at once. You must give your body and brain enough time for recovery.  Do not return to sports or other athletic activities until your health care provider tells you it is safe to do so.  Ask  your health care provider when you can drive, ride a bicycle, or operate heavy machinery. Your ability to react may be slower after a brain injury. Never do these activities if you are dizzy.  Ask your health care provider about when you can return to work or school. Preventing Another Concussion It is very important to avoid another brain injury, especially before you have recovered. In rare cases, another injury can lead to permanent brain damage, brain swelling, or death. The risk of this is greatest during the first 7-10 days after a head injury. Avoid injuries by:  Wearing a seat belt when riding in a car.  Drinking alcohol only in moderation.  Wearing a helmet when biking, skiing, skateboarding, skating, or doing similar activities.  Avoiding activities that could lead to a second concussion, such as contact or recreational sports, until your health care provider says it is okay.  Taking safety measures in your home.  Remove clutter and tripping hazards from floors and stairways.  Use grab bars in bathrooms and handrails by stairs.  Place non-slip mats on floors and in bathtubs.  Improve lighting in dim areas. SEEK MEDICAL CARE IF:  You have increased problems paying attention or  concentrating.  You have increased difficulty remembering or learning new information.  You need more time to complete tasks or assignments than before.  You have increased irritability or decreased ability to cope with stress.  You have more symptoms than before. Seek medical care if you have any of the following symptoms for more than 2 weeks after your injury:  Lasting (chronic) headaches.  Dizziness or balance problems.  Nausea.  Vision problems.  Increased sensitivity to noise or light.  Depression or mood swings.  Anxiety or irritability.  Memory problems.  Difficulty concentrating or paying attention.  Sleep problems.  Feeling tired all the time. SEEK IMMEDIATE MEDICAL CARE IF:  You have severe or worsening headaches. These may be a sign of a blood clot in the brain.  You have weakness (even if only in one hand, leg, or part of the face).  You have numbness.  You have decreased coordination.  You vomit repeatedly.  You have increased sleepiness.  One pupil is larger than the other.  You have convulsions.  You have slurred speech.  You have increased confusion. This may be a sign of a blood clot in the brain.  You have increased restlessness, agitation, or irritability.  You are unable to recognize people or places.  You have neck pain.  It is difficult to wake you up.  You have unusual behavior changes.  You lose consciousness. MAKE SURE YOU:  Understand these instructions.  Will watch your condition.  Will get help right away if you are not doing well or get worse.   This information is not intended to replace advice given to you by your health care provider. Make sure you discuss any questions you have with your health care provider.   Document Released: 12/08/2003 Document Revised: 10/08/2014 Document Reviewed: 04/09/2013 Elsevier Interactive Patient Education [address].    [place], Dahlia Client R., DO

## 2016-04-26 NOTE — Progress Notes (Signed)
Pre visit review using our clinic review tool, if applicable. No additional management support is needed unless otherwise documented below in the visit note. 

## 2016-06-11 DIAGNOSIS — M720 Palmar fascial fibromatosis [Dupuytren]: Secondary | ICD-10-CM | POA: Diagnosis not present

## 2016-06-11 DIAGNOSIS — M722 Plantar fascial fibromatosis: Secondary | ICD-10-CM | POA: Diagnosis not present

## 2016-06-11 DIAGNOSIS — G5751 Tarsal tunnel syndrome, right lower limb: Secondary | ICD-10-CM | POA: Diagnosis not present

## 2016-06-11 DIAGNOSIS — G5761 Lesion of plantar nerve, right lower limb: Secondary | ICD-10-CM | POA: Diagnosis not present

## 2016-06-11 DIAGNOSIS — G5760 Lesion of plantar nerve, unspecified lower limb: Secondary | ICD-10-CM | POA: Diagnosis not present

## 2016-06-11 DIAGNOSIS — M2011 Hallux valgus (acquired), right foot: Secondary | ICD-10-CM | POA: Diagnosis not present

## 2016-06-12 ENCOUNTER — Other Ambulatory Visit: Payer: BLUE CROSS/BLUE SHIELD

## 2016-06-14 DIAGNOSIS — Z6821 Body mass index (BMI) 21.0-21.9, adult: Secondary | ICD-10-CM | POA: Diagnosis not present

## 2016-06-14 DIAGNOSIS — Z113 Encounter for screening for infections with a predominantly sexual mode of transmission: Secondary | ICD-10-CM | POA: Diagnosis not present

## 2016-06-14 DIAGNOSIS — Z01419 Encounter for gynecological examination (general) (routine) without abnormal findings: Secondary | ICD-10-CM | POA: Diagnosis not present

## 2016-06-14 DIAGNOSIS — N760 Acute vaginitis: Secondary | ICD-10-CM | POA: Diagnosis not present

## 2016-06-19 ENCOUNTER — Ambulatory Visit: Payer: BLUE CROSS/BLUE SHIELD | Admitting: Family Medicine

## 2016-06-26 DIAGNOSIS — Z3202 Encounter for pregnancy test, result negative: Secondary | ICD-10-CM | POA: Diagnosis not present

## 2016-06-26 DIAGNOSIS — Z30430 Encounter for insertion of intrauterine contraceptive device: Secondary | ICD-10-CM | POA: Diagnosis not present

## 2016-09-01 DIAGNOSIS — R1084 Generalized abdominal pain: Secondary | ICD-10-CM | POA: Diagnosis not present

## 2016-09-01 DIAGNOSIS — R102 Pelvic and perineal pain: Secondary | ICD-10-CM | POA: Diagnosis not present

## 2016-09-01 DIAGNOSIS — R8299 Other abnormal findings in urine: Secondary | ICD-10-CM | POA: Diagnosis not present

## 2016-09-01 DIAGNOSIS — R1011 Right upper quadrant pain: Secondary | ICD-10-CM | POA: Diagnosis not present

## 2016-09-01 DIAGNOSIS — N912 Amenorrhea, unspecified: Secondary | ICD-10-CM | POA: Diagnosis not present

## 2016-09-04 DIAGNOSIS — Z30431 Encounter for routine checking of intrauterine contraceptive device: Secondary | ICD-10-CM | POA: Diagnosis not present

## 2016-09-13 DIAGNOSIS — R1031 Right lower quadrant pain: Secondary | ICD-10-CM | POA: Diagnosis not present

## 2017-02-12 DIAGNOSIS — Z30433 Encounter for removal and reinsertion of intrauterine contraceptive device: Secondary | ICD-10-CM | POA: Diagnosis not present

## 2017-08-16 ENCOUNTER — Ambulatory Visit: Payer: BLUE CROSS/BLUE SHIELD | Admitting: Adult Health

## 2017-08-16 ENCOUNTER — Encounter: Payer: Self-pay | Admitting: Adult Health

## 2017-08-16 ENCOUNTER — Ambulatory Visit (INDEPENDENT_AMBULATORY_CARE_PROVIDER_SITE_OTHER)
Admission: RE | Admit: 2017-08-16 | Discharge: 2017-08-16 | Disposition: A | Discharge status: Home / Self Care | Payer: BLUE CROSS/BLUE SHIELD | Source: Ambulatory Visit | Attending: Adult Health | Admitting: Adult Health

## 2017-08-16 VITALS — BP 106/56 | Temp 98.7°F | Wt 127.0 lb

## 2017-08-16 DIAGNOSIS — N3001 Acute cystitis with hematuria: Secondary | ICD-10-CM | POA: Diagnosis not present

## 2017-08-16 DIAGNOSIS — M79672 Pain in left foot: Secondary | ICD-10-CM | POA: Diagnosis not present

## 2017-08-16 DIAGNOSIS — S99922A Unspecified injury of left foot, initial encounter: Secondary | ICD-10-CM | POA: Diagnosis not present

## 2017-08-16 LAB — POCT URINALYSIS DIPSTICK
Bilirubin, UA: NEGATIVE
Blood, UA: 2
Glucose, UA: NEGATIVE
KETONES UA: 2
Nitrite, UA: NEGATIVE
PH UA: 6 (ref 5.0–8.0)
PROTEIN UA: NEGATIVE
SPEC GRAV UA: 1.025 (ref 1.010–1.025)
UROBILINOGEN UA: 0.2 U/dL

## 2017-08-16 LAB — BASIC METABOLIC PANEL
BUN: 9 mg/dL (ref 6–23)
CO2: 28 mEq/L (ref 19–32)
Calcium: 9.7 mg/dL (ref 8.4–10.5)
Chloride: 105 mEq/L (ref 96–112)
Creatinine, Ser: 0.77 mg/dL (ref 0.40–1.20)
GFR: 97.39 mL/min (ref >60.00)
Glucose, Bld: 131 mg/dL — ABNORMAL HIGH (ref 70–99)
POTASSIUM: 3.7 meq/L (ref 3.5–5.1)
SODIUM: 141 meq/L (ref 135–145)

## 2017-08-16 MED ORDER — NITROFURANTOIN MONOHYD MACRO 100 MG PO CAPS: 100.0000 mg | ORAL_CAPSULE | Freq: Two times a day (BID) | ORAL | 0 refills | Status: DC

## 2017-08-16 NOTE — Progress Notes (Signed)
Subjective:    Patient ID: Carol Smith, female    DOB: 12-28-1990, 26 y.o.   MRN: [id number]  HPI  26 year old female who  has no past medical history on file. She presents to the office today for multiple complaints.   1. Low back pain that started five days ago. She was in [place] and was surfing when she fell of her board causing injury to her back. Since that time she has had hematuria( episodes of clots) ,dysuria, and frequency. She is unsure if her symptoms are related to a UTI or from the trauma from falling off her surf board.   2. She hit her 4 and 5th toes on the left foot on a table two nights ago. . She has had reconstructive surgery on her left foot in the past . Has tenderness throughout left tfoot.   Review of Systems See HPI   History reviewed. No pertinent past medical history.  Social History   Socioeconomic History  . Marital status: Single    Spouse name: Not on file  . Number of children: Not on file  . Years of education: Not on file  . Highest education level: Not on file  Social Needs  . Financial resource strain: Not on file  . Food insecurity - worry: Not on file  . Food insecurity - inability: Not on file  . Transportation needs - medical: Not on file  . Transportation needs - non-medical: Not on file  Occupational History  . Not on file  Tobacco Use  . Smoking status: Never Smoker  . Smokeless tobacco: Never Used  Substance and Sexual Activity  . Alcohol use: Yes  . Drug use: No  . Sexual activity: Not on file  Other Topics Concern  . Not on file  Social History Narrative  . Not on file    Past Surgical History:  Procedure Laterality Date  . ANKLE SURGERY  01/2014  . FOOT SURGERY  01/2014  . TARSAL TUNNEL RELEASE Right     Family History  Problem Relation Age of Onset  . Cancer Other   . Hypertension Other     No Known Allergies  Current Outpatient Medications on File Prior to Visit  Medication Sig Dispense Refill  .  Multiple Vitamin (MULTIVITAMIN WITH MINERALS) TABS tablet Take 1 tablet by mouth daily.     No current facility-administered medications on file prior to visit.     BP (!) 106/56 (BP Location: Left Arm)   Temp 98.7 F (37.1 C) (Oral)   Wt 127 lb (57.6 kg)   BMI 19.31 kg/m       Objective:   Physical Exam  Constitutional: She is oriented to person, place, and time. She appears well-developed and well-nourished. No distress.  Cardiovascular: Normal rate, regular rhythm, normal heart sounds and intact distal pulses. Exam reveals no gallop and no friction rub.  No murmur heard. Pulmonary/Chest: Effort normal and breath sounds normal. No respiratory distress. She has no wheezes. She has no rales. She exhibits no tenderness.  Abdominal: Soft. Bowel sounds are normal. She exhibits no distension and no mass. There is no tenderness. There is CVA tenderness (right side ). There is no rebound and no guarding.  Musculoskeletal: Normal range of motion. She exhibits tenderness (along low back and throughout left foot ).  Neurological: She is alert and oriented to person, place, and time.  Skin: Skin is warm and dry. She is not diaphoretic.  Bruising noted  along distal end of 4th and 5th toes on left foot.  No bruising to lower back    Psychiatric: She has a normal mood and affect. Her behavior is normal. Judgment and thought content normal.  Nursing note and vitals reviewed.      Assessment & Plan:   1. Acute cystitis with hematuria - Back pain probably multifactorial. There seems to be some MSK pain from the trauma and also discomfort from UTI. There was no visible blood in urine. Will check BMP to r/o kidney kidney damage  - Basic Metabolic Panel - nitrofurantoin, macrocrystal-monohydrate, (MACROBID) 100 MG capsule; Take 1 capsule (100 mg total) 2 (two) times daily by mouth.  Dispense: 10 capsule; Refill: 0 - POC Urinalysis Dipstick + leuks, blood, ketones - Culture, Urine  2. Left foot  pain - Possibly fracture toes. Advised tylenol until labs come back and buddy tape. Will get x ray dt reconstructive surgery in the past - DG Foot Complete Left; Future  Shirline Freesory Shiara Mcgough, NP

## 2017-08-19 LAB — URINE CULTURE
MICRO NUMBER: 81295737
SPECIMEN QUALITY: ADEQUATE

## 2017-09-04 DIAGNOSIS — N390 Urinary tract infection, site not specified: Secondary | ICD-10-CM | POA: Diagnosis not present

## 2017-09-04 DIAGNOSIS — B373 Candidiasis of vulva and vagina: Secondary | ICD-10-CM | POA: Diagnosis not present

## 2017-09-27 DIAGNOSIS — N760 Acute vaginitis: Secondary | ICD-10-CM | POA: Diagnosis not present

## 2017-10-17 DIAGNOSIS — Z681 Body mass index (BMI) 19 or less, adult: Secondary | ICD-10-CM | POA: Diagnosis not present

## 2017-10-17 DIAGNOSIS — Z01419 Encounter for gynecological examination (general) (routine) without abnormal findings: Secondary | ICD-10-CM | POA: Diagnosis not present

## 2017-10-17 DIAGNOSIS — N760 Acute vaginitis: Secondary | ICD-10-CM | POA: Diagnosis not present

## 2017-10-30 ENCOUNTER — Encounter: Payer: Self-pay | Admitting: Family Medicine

## 2017-10-30 ENCOUNTER — Ambulatory Visit: Payer: BLUE CROSS/BLUE SHIELD | Admitting: Family Medicine

## 2017-10-30 VITALS — BP 116/60 | HR 65 | Temp 98.5°F | Resp 12 | Ht 68.0 in | Wt 128.4 lb

## 2017-10-30 DIAGNOSIS — R1011 Right upper quadrant pain: Secondary | ICD-10-CM

## 2017-10-30 DIAGNOSIS — R110 Nausea: Secondary | ICD-10-CM | POA: Diagnosis not present

## 2017-10-30 LAB — COMPREHENSIVE METABOLIC PANEL
ALBUMIN: 4.3 g/dL (ref 3.5–5.2)
ALK PHOS: 28 U/L — AB (ref 39–117)
ALT: 17 U/L (ref 0–35)
AST: 16 U/L (ref 0–37)
BUN: 14 mg/dL (ref 6–23)
CO2: 27 mEq/L (ref 19–32)
CREATININE: 0.84 mg/dL (ref 0.40–1.20)
Calcium: 9.6 mg/dL (ref 8.4–10.5)
Chloride: 105 mEq/L (ref 96–112)
GFR: 87.94 mL/min (ref >60.00)
Glucose, Bld: 85 mg/dL (ref 70–99)
Potassium: 3.8 mEq/L (ref 3.5–5.1)
SODIUM: 138 meq/L (ref 135–145)
TOTAL PROTEIN: 6.7 g/dL (ref 6.0–8.3)
Total Bilirubin: 0.6 mg/dL (ref 0.2–1.2)

## 2017-10-30 LAB — CBC
HCT: 39.1 % (ref 36.0–46.0)
HEMOGLOBIN: 13.4 g/dL (ref 12.0–15.0)
MCHC: 34.1 g/dL (ref 30.0–36.0)
MCV: 95 fl (ref 78.0–100.0)
PLATELETS: 218 10*3/uL (ref 150.0–400.0)
RBC: 4.12 Mil/uL (ref 3.87–5.11)
RDW: 12.8 % (ref 11.5–15.5)
WBC: 4.6 10*3/uL (ref 4.0–10.5)

## 2017-10-30 MED ORDER — ONDANSETRON HCL 4 MG PO TABS: 4.0000 mg | ORAL_TABLET | Freq: Three times a day (TID) | ORAL | 0 refills | Status: DC | PRN

## 2017-10-30 NOTE — Patient Instructions (Addendum)
  [name] I have seen you today for an acute visit.  A few things to remember from today's visit:   Abdominal pain, RUQ - Plan: US Abdomen Limited RUQ, Comprehensive metabolic panel, CBC  Nausea without vomiting - Plan: Comprehensive metabolic panel   Medications prescribed today are intended for short period of time and will not be refill upon request, a follow up appointment might be necessary to discuss continuation of of treatment if appropriate.   Follow a bland diet for the next few days, small meals, adequate hydration.   GET HELP RIGHT AWAY IF:   The pain is does not go away within 2 hours.  Sudden severe/worsening pain.  You keep throwing up (vomiting).  The pain changes and is only in the right or left part of the belly.  Not being able to pass gas or poop.  You have bloody or tarry looking poop.   MAKE SURE YOU:   Understand these instructions.  Will watch your condition.  Will get help right away if you are not doing well or get worse.   If symptoms are persistent please arrange a follow up appointment.     In general please monitor for signs of worsening symptoms and seek immediate medical attention if any concerning.   I hope you get better soon!

## 2017-10-30 NOTE — Progress Notes (Signed)
ACUTE VISIT   HPI:  Chief Complaint  Patient presents with  . Right-sided pain    off and on for over a year    Ms.Phillips OdorOlivia A Salehi is a 27 y.o. female, who is here today complaining of RUQ/underneath rib cage abdominal pain intermittently since 2017. Initially pain was occasional but it is increasing in frequency and intensity. Last episode was yesterday, usually in the morning. Sometimes radiated to right low back and associated with nausea.  Achy and sometimes sharp,6/10.  She has not identified alleviating factors, she has tried OTC Acetaminophen and Ibuprofen as well as local heat but needed to these help with pain. Pain usually last from 30 minutes to 3 hours. It seems to be exacerbated by "intense" physical activity or "big meals."  Nausea is exacerbated by any food intake, mainly in the morning. She denies associated fever, chills, fatigue, vomiting, or urinary symptoms.  She had some loose stools a few days ago, attributed to Metronidazole, which she took recently to treat BV.  No Hx of trauma. She has not noted skin rash on affected area.  LMP 10/13/17.  She has Hx of lower back pain (per records).  Review of Systems  Constitutional: Negative for activity change, appetite change, fatigue and fever.  HENT: Negative for mouth sores, sore throat and trouble swallowing.   Respiratory: Negative for cough, shortness of breath and wheezing.   Gastrointestinal: Positive for abdominal pain and nausea. Negative for abdominal distention, blood in stool and vomiting.  Genitourinary: Negative for dysuria, frequency, hematuria, vaginal bleeding and vaginal discharge.  Musculoskeletal: Positive for back pain. Negative for arthralgias and joint swelling.  Skin: Negative for pallor and rash.  Allergic/Immunologic: Negative for food allergies.  Neurological: Negative for weakness and numbness.  Hematological: Negative for adenopathy. Does not bruise/bleed easily.       Current Outpatient Medications on File Prior to Visit  Medication Sig Dispense Refill  . Multiple Vitamin (MULTIVITAMIN WITH MINERALS) TABS tablet Take 1 tablet by mouth daily.    . nitrofurantoin, macrocrystal-monohydrate, (MACROBID) 100 MG capsule Take 1 capsule (100 mg total) 2 (two) times daily by mouth. (Patient not taking: Reported on 10/30/2017) 10 capsule 0   No current facility-administered medications on file prior to visit.      Past Medical History:  Diagnosis Date  . ADD (attention deficit disorder)    No Known Allergies  Social History   Socioeconomic History  . Marital status: Single    Spouse name: None  . Number of children: None  . Years of education: None  . Highest education level: None  Social Needs  . Financial resource strain: None  . Food insecurity - worry: None  . Food insecurity - inability: None  . Transportation needs - medical: None  . Transportation needs - non-medical: None  Occupational History  . None  Tobacco Use  . Smoking status: Never Smoker  . Smokeless tobacco: Never Used  Substance and Sexual Activity  . Alcohol use: Yes  . Drug use: No  . Sexual activity: None  Other Topics Concern  . None  Social History Narrative  . None    Vitals:   10/30/17 0806  BP: 116/60  Pulse: 65  Resp: 12  Temp: 98.5 F (36.9 C)  SpO2: 100%   Body mass index is 19.52 kg/m.   Physical Exam  Nursing note and vitals reviewed. Constitutional: She is oriented to person, place, and time. She appears well-developed and well-nourished. She  does not appear ill. No distress.  HENT:  Head: Normocephalic and atraumatic.  Mouth/Throat: Oropharynx is clear and moist and mucous membranes are normal.  Eyes: Conjunctivae are normal. No scleral icterus.  Cardiovascular: Normal rate and regular rhythm.  No murmur heard. Respiratory: Effort normal and breath sounds normal. No respiratory distress.  GI: Soft. Bowel sounds are normal. She  exhibits no distension and no mass. There is no hepatomegaly. There is tenderness in the right upper quadrant and left lower quadrant. There is no rebound, no guarding and no CVA tenderness.  Musculoskeletal: She exhibits no edema or tenderness.  Lymphadenopathy:    She has no cervical adenopathy.       Right: No supraclavicular adenopathy present.       Left: No supraclavicular adenopathy present.  Neurological: She is alert and oriented to person, place, and time. She has normal strength. Gait normal.  Skin: Skin is warm. No rash noted. No erythema.  Psychiatric: She has a normal mood and affect.  Well groomed, good eye contact.      ASSESSMENT AND PLAN:   Ms. Ilaisaane was seen today for right-sided pain.  Diagnoses and all orders for this visit:  Lab Results  Component Value Date   ALT 17 10/30/2017   AST 16 10/30/2017   ALKPHOS 28 (L) 10/30/2017   BILITOT 0.6 10/30/2017   Lab Results  Component Value Date   CREATININE 0.84 10/30/2017   BUN 14 10/30/2017   NA 138 10/30/2017   K 3.8 10/30/2017   CL 105 10/30/2017   CO2 27 10/30/2017   Lab Results  Component Value Date   WBC 4.6 10/30/2017   HGB 13.4 10/30/2017   HCT 39.1 10/30/2017   MCV 95.0 10/30/2017   PLT 218.0 10/30/2017    Abdominal pain, RUQ  We discussed possible etiologies: gall bladder disease,dyspepsia,and musculoskeletal among some. Further recommendations will be given according to labs/imaging results. Clearly instructed about warning signs.  -     US Abdomen Limited RUQ; Future -     Comprehensive metabolic panel -     CBC  Nausea without vomiting  Small meals and avoidance of greasy foods recommended. Zofran 4 mg tid prn may help. Instructed about warnings.  -     Comprehensive metabolic panel -     ondansetron (ZOFRAN) 4 MG tablet; Take 1 tablet (4 mg total) by mouth every 8 (eight) hours as needed for nausea or vomiting.      -Ms.Phillips Odor was advised to seek immediate  medical attention if sudden worsening symptoms or to follow if they persist or if new concerns arise.       Jacquel Mccamish G. [place], MD  [facility]. [place] office.

## 2017-11-02 ENCOUNTER — Encounter: Payer: Self-pay | Admitting: Family Medicine

## 2017-11-05 ENCOUNTER — Encounter: Payer: Self-pay | Admitting: *Deleted

## 2017-11-06 ENCOUNTER — Other Ambulatory Visit: Payer: BLUE CROSS/BLUE SHIELD

## 2017-11-06 ENCOUNTER — Ambulatory Visit
Admission: RE | Admit: 2017-11-06 | Discharge: 2017-11-06 | Disposition: A | Discharge status: Home / Self Care | Payer: BLUE CROSS/BLUE SHIELD | Source: Ambulatory Visit | Attending: Family Medicine | Admitting: Family Medicine

## 2017-11-06 DIAGNOSIS — R1011 Right upper quadrant pain: Secondary | ICD-10-CM | POA: Diagnosis not present

## 2017-11-07 DIAGNOSIS — N760 Acute vaginitis: Secondary | ICD-10-CM | POA: Diagnosis not present

## 2017-12-25 DIAGNOSIS — D485 Neoplasm of uncertain behavior of skin: Secondary | ICD-10-CM | POA: Diagnosis not present

## 2017-12-25 DIAGNOSIS — D225 Melanocytic nevi of trunk: Secondary | ICD-10-CM | POA: Diagnosis not present

## 2018-03-22 ENCOUNTER — Encounter (HOSPITAL_COMMUNITY): Payer: Self-pay | Admitting: Emergency Medicine

## 2018-03-22 ENCOUNTER — Emergency Department (HOSPITAL_COMMUNITY)
Admission: EM | Admit: 2018-03-22 | Discharge: 2018-03-23 | Disposition: A | Discharge status: Home / Self Care | Payer: BLUE CROSS/BLUE SHIELD | Attending: Emergency Medicine | Admitting: Emergency Medicine

## 2018-03-22 ENCOUNTER — Emergency Department (HOSPITAL_COMMUNITY): Payer: BLUE CROSS/BLUE SHIELD

## 2018-03-22 DIAGNOSIS — R1012 Left upper quadrant pain: Secondary | ICD-10-CM | POA: Diagnosis not present

## 2018-03-22 DIAGNOSIS — B373 Candidiasis of vulva and vagina: Secondary | ICD-10-CM | POA: Diagnosis not present

## 2018-03-22 DIAGNOSIS — R110 Nausea: Secondary | ICD-10-CM | POA: Diagnosis not present

## 2018-03-22 DIAGNOSIS — R0789 Other chest pain: Secondary | ICD-10-CM | POA: Diagnosis not present

## 2018-03-22 DIAGNOSIS — R42 Dizziness and giddiness: Secondary | ICD-10-CM | POA: Insufficient documentation

## 2018-03-22 DIAGNOSIS — R079 Chest pain, unspecified: Secondary | ICD-10-CM

## 2018-03-22 DIAGNOSIS — R1084 Generalized abdominal pain: Secondary | ICD-10-CM | POA: Diagnosis not present

## 2018-03-22 DIAGNOSIS — Z79899 Other long term (current) drug therapy: Secondary | ICD-10-CM | POA: Diagnosis not present

## 2018-03-22 DIAGNOSIS — R109 Unspecified abdominal pain: Secondary | ICD-10-CM | POA: Diagnosis not present

## 2018-03-22 DIAGNOSIS — N390 Urinary tract infection, site not specified: Secondary | ICD-10-CM | POA: Diagnosis not present

## 2018-03-22 HISTORY — DX: Concussion with loss of consciousness of unspecified duration, initial encounter: S06.0X9A

## 2018-03-22 LAB — I-STAT TROPONIN, ED: Troponin i, poc: 0 ng/mL (ref 0.00–0.08)

## 2018-03-22 LAB — BASIC METABOLIC PANEL
Anion gap: 10 (ref 5–15)
BUN: 10 mg/dL (ref 6–20)
CALCIUM: 9.7 mg/dL (ref 8.9–10.3)
CO2: 24 mmol/L (ref 22–32)
Chloride: 103 mmol/L (ref 101–111)
Creatinine, Ser: 0.82 mg/dL (ref 0.44–1.00)
GFR calc Af Amer: >60 mL/min (ref >60)
GLUCOSE: 109 mg/dL — AB (ref 65–99)
POTASSIUM: 3.9 mmol/L (ref 3.5–5.1)
Sodium: 137 mmol/L (ref 135–145)

## 2018-03-22 LAB — CBC
HEMATOCRIT: 39.7 % (ref 36.0–46.0)
Hemoglobin: 12.8 g/dL (ref 12.0–15.0)
MCH: 31.5 pg (ref 26.0–34.0)
MCHC: 32.2 g/dL (ref 30.0–36.0)
MCV: 97.8 fL (ref 78.0–100.0)
Platelets: 196 10*3/uL (ref 150–400)
RBC: 4.06 MIL/uL (ref 3.87–5.11)
RDW: 11.9 % (ref 11.5–15.5)
WBC: 6.3 10*3/uL (ref 4.0–10.5)

## 2018-03-22 LAB — I-STAT BETA HCG BLOOD, ED (MC, WL, AP ONLY): I-stat hCG, quantitative: <5 m[IU]/mL (ref <5)

## 2018-03-22 NOTE — ED Triage Notes (Signed)
Pt reports sudden onset dizziness, ARm pain, chest pain. Reports she was standing at work when it started yesterday. Reports R sided abd pain for two years.   Actively being treated for UTI

## 2018-03-23 ENCOUNTER — Emergency Department (HOSPITAL_COMMUNITY): Payer: BLUE CROSS/BLUE SHIELD

## 2018-03-23 DIAGNOSIS — R109 Unspecified abdominal pain: Secondary | ICD-10-CM | POA: Diagnosis not present

## 2018-03-23 LAB — URINALYSIS, ROUTINE W REFLEX MICROSCOPIC
Bilirubin Urine: NEGATIVE
Glucose, UA: NEGATIVE mg/dL
HGB URINE DIPSTICK: NEGATIVE
Ketones, ur: 20 mg/dL — AB
Leukocytes, UA: NEGATIVE
NITRITE: NEGATIVE
PROTEIN: NEGATIVE mg/dL
SPECIFIC GRAVITY, URINE: 1.03 (ref 1.005–1.030)
pH: 5 (ref 5.0–8.0)

## 2018-03-23 NOTE — ED Provider Notes (Signed)
[facility] EMERGENCY DEPARTMENT Provider Note   CSN: [id number] Arrival date & time: 03/22/18  1932     History   Chief Complaint Chief Complaint  Patient presents with  . Chest Pain  . Abdominal Pain    HPI Carol Smith is a 27 y.o. female.  HPI Patient is a 27 year old female presents to the emergency department with an episode of lightheadedness and chest pressure which occurred while at work on Friday. This lasted for several minutes. She states since then she is felt overall "not herself". No dysuria or urinary frequency. She has had some mild left flank and left sided abdominal pain. She reports some nausea without vomiting. No diarrhea. No fevers or chills. No productive cough. No shortness of breath. She has some recurrence of chest pressure today and this alarmed her and that she can the ER for further evaluation. Oral temperature of 99.8 found on arrival to the emergency department. No new rash. No new medications. No change in diet or exercise regimens. Mother reports long-standing history of recurrent abdominal pain without clear etiology.she denies chest discomfort at this time  Past Medical History:  Diagnosis Date  . ADD (attention deficit disorder)   . Concussion     Patient Active Problem List   Diagnosis Date Noted  . Low back pain radiating to left leg 10/11/2015  . ADD (attention deficit disorder) without hyperactivity 07/15/2014    Past Surgical History:  Procedure Laterality Date  . ANKLE SURGERY  01/2014  . FOOT SURGERY  01/2014  . TARSAL TUNNEL RELEASE Right      OB History   None      Home Medications    Prior to Admission medications   Medication Sig Start Date End Date Taking? Authorizing Provider  Multiple Vitamin (MULTIVITAMIN WITH MINERALS) TABS tablet Take 1 tablet by mouth daily.    [provider]  nitrofurantoin, macrocrystal-monohydrate, (MACROBID) 100 MG capsule Take 1 capsule (100 mg total) 2 (two)  times daily by mouth. Patient not taking: Reported on 10/30/2017 08/16/17   Shirline Frees, NP  ondansetron (ZOFRAN) 4 MG tablet Take 1 tablet (4 mg total) by mouth every 8 (eight) hours as needed for nausea or vomiting. 10/30/17   [place], Betty G, MD    Family History Family History  Problem Relation Age of Onset  . Cancer Other   . Hypertension Other     Social History Social History   Tobacco Use  . Smoking status: Never Smoker  . Smokeless tobacco: Never Used  Substance Use Topics  . Alcohol use: Yes  . Drug use: No     Allergies   Patient has no known allergies.   Review of Systems Review of Systems  All other systems reviewed and are negative.    Physical Exam Updated Vital Signs BP 110/64   Pulse 79   Temp 99.8 F (37.7 C) (Oral)   Resp 16   Ht 5\' 8"  (1.727 m)   Wt 54.4 kg (120 lb)   LMP 02/25/2018 (Exact Date)   SpO2 100%   BMI 18.25 kg/m   Physical Exam  Constitutional: She is oriented to person, place, and time. She appears well-developed and well-nourished. No distress.  HENT:  Head: Normocephalic and atraumatic.  Eyes: EOM are normal.  Neck: Normal range of motion.  Cardiovascular: Normal rate, regular rhythm and normal heart sounds.  Pulmonary/Chest: Effort normal and breath sounds normal.  Abdominal: Soft. She exhibits no distension.  Mild left-sided abdominal  tenderness without guarding or rebound. No severe left CVA tenderness  Musculoskeletal: Normal range of motion.  Neurological: She is alert and oriented to person, place, and time.  Skin: Skin is warm and dry.  Psychiatric: She has a normal mood and affect. Judgment normal.  Nursing note and vitals reviewed.    ED Treatments / Results  Labs (all labs ordered are listed, but only abnormal results are displayed) Labs Reviewed  BASIC METABOLIC PANEL - Abnormal; Notable for the following components:      Result Value   Glucose, Bld 109 (*)    All other components within normal  limits  URINALYSIS, ROUTINE W REFLEX MICROSCOPIC - Abnormal; Notable for the following components:   APPearance HAZY (*)    Ketones, ur 20 (*)    All other components within normal limits  URINE CULTURE  CBC  I-STAT TROPONIN, ED  I-STAT BETA HCG BLOOD, ED ([facility], [facility], [facility] ONLY)    EKG EKG Interpretation  Date/Time:  Saturday March 22 2018 19:46:44 EDT Ventricular Rate:  82 PR Interval:  134 QRS Duration: 78 QT Interval:  370 QTC Calculation: 432 R Axis:   85 Text Interpretation:  Normal sinus rhythm Normal ECG No significant change was found Confirmed by Azalia Bilisampos, Danai Gotto ([id number]) on 03/23/2018 12:34:03 AM   Radiology Dg Chest 2 View  Result Date: 03/22/2018 CLINICAL DATA:  Sudden onset dizziness, chest pain and arm pain. EXAM: CHEST - 2 VIEW COMPARISON:  Chest CTA dated 09/18/2010. FINDINGS: Normal sized heart. Clear lungs with normal vascularity. Mild central peribronchial thickening. Unremarkable bones. IMPRESSION: Mild bronchitic changes. Electronically Signed   By: Beckie SaltsSteven  Reid M.D.   On: 03/22/2018 21:30    Procedures Procedures (including critical care time)  Medications Ordered in ED Medications - No data to display   Initial Impression / Assessment and Plan / ED Course  I have reviewed the triage vital signs and the nursing notes.  Pertinent labs & imaging results that were available during my care of the patient were reviewed by me and considered in my medical decision making (see chart for details).     Seen in urgent care and prescribed an antibiotic for possible UTI despite no urinary symptoms. The patients chest pain is nonspecific. Doubt ACS. Doubt pulmonary blows and. Doubt aortic dissection. Dissection. Given her mild left-sided abdominal pain and questionable UTI at the urgent care CT stone study was performed which is without acute pathology. Urine demonstrates no signs of infection. I do not think she needs to fill her antibiotics.  Urine has been sent for culture.   Close primary care follow-up.  Patient understands return to the emergency department for new or worsening symptoms  Final Clinical Impressions(s) / ED Diagnoses   Final diagnoses:  Chest pain, unspecified type    ED Discharge Orders    None       Azalia Bilisampos, Sigurd Pugh, MD 03/23/18 [phone]

## 2018-03-24 LAB — URINE CULTURE: Culture: NO GROWTH

## 2018-03-28 ENCOUNTER — Ambulatory Visit: Payer: BLUE CROSS/BLUE SHIELD | Admitting: Family Medicine

## 2018-08-14 DIAGNOSIS — D485 Neoplasm of uncertain behavior of skin: Secondary | ICD-10-CM | POA: Diagnosis not present

## 2018-08-14 DIAGNOSIS — D225 Melanocytic nevi of trunk: Secondary | ICD-10-CM | POA: Diagnosis not present

## 2018-11-04 DIAGNOSIS — M25561 Pain in right knee: Secondary | ICD-10-CM | POA: Diagnosis not present

## 2018-11-05 DIAGNOSIS — Z113 Encounter for screening for infections with a predominantly sexual mode of transmission: Secondary | ICD-10-CM | POA: Diagnosis not present

## 2018-11-05 DIAGNOSIS — Z681 Body mass index (BMI) 19 or less, adult: Secondary | ICD-10-CM | POA: Diagnosis not present

## 2018-11-05 DIAGNOSIS — Z01419 Encounter for gynecological examination (general) (routine) without abnormal findings: Secondary | ICD-10-CM | POA: Diagnosis not present

## 2018-11-05 DIAGNOSIS — N760 Acute vaginitis: Secondary | ICD-10-CM | POA: Diagnosis not present

## 2018-11-06 DIAGNOSIS — M25561 Pain in right knee: Secondary | ICD-10-CM | POA: Diagnosis not present

## 2018-11-10 DIAGNOSIS — M25561 Pain in right knee: Secondary | ICD-10-CM | POA: Diagnosis not present

## 2018-12-22 ENCOUNTER — Ambulatory Visit: Payer: Self-pay | Admitting: *Deleted

## 2018-12-22 DIAGNOSIS — B349 Viral infection, unspecified: Secondary | ICD-10-CM | POA: Diagnosis not present

## 2018-12-22 NOTE — Telephone Encounter (Signed)
   Reason for Disposition . [1] Caller concerned that exposure to COVID-19 occurred BUT [2] does not meet COVID-19 EXPOSURE criteria from [facility]  Answer Assessment - Initial Assessment Questions 1. CONFIRMED CASE: "Who is the person with the confirmed COVID-19 infection that you were exposed to?"     No 2. PLACE of CONTACT: "Where were you when you were exposed to COVID-19  (coronavirus disease 2019)?" (e.g., city, state, country)     In [place], [place] 3. TYPE of CONTACT: "How much contact was there?" (e.g., live in same house, work in same office, same school)     I was flying and went into resturants.     4. DATE of CONTACT: "When did you have contact with a coronavirus patient?" (e.g., days)     I didn't 5. DURATION of CONTACT: "How long were you in contact with the COVID-19 (coronavirus disease) patient?" (e.g., a few seconds, passed by person, a few minutes, live with the patient)     Not in contact.    6. SYMPTOMS: "Do you have any symptoms?" (e.g., fever, cough, breathing difficulty)     Dry cough varying fever 100.3 - 101 7. PREGNANCY OR POSTPARTUM: "Is there any chance you are pregnant?" "When was your last menstrual period?" "Did you deliver in the last 2 weeks?"     Not asked 8. HIGH RISK: "Do you have any heart or lung problems? Do you have a weakened immune system?" (e.g., CHF, COPD, asthma, HIV positive, chemotherapy, renal failure, diabetes mellitus, sickle cell anemia)     She traveled to [place], [place] was wondering if she needed to be tested due to being sick.  Protocols used: CORONAVIRUS (COVID-19) EXPOSURE-A-AH

## 2019-02-11 ENCOUNTER — Telehealth: Payer: Self-pay | Admitting: *Deleted

## 2019-02-11 NOTE — Telephone Encounter (Signed)
Copied from CRM [id number]. Topic: Appointment Scheduling - Scheduling Inquiry for Clinic >> Feb 10, 2019  4:33 PM Debroah Loop wrote: Reason for CRM: Patient was seeing Tawanna Cooler and needs to transfer. Needs appt for ear pain, teeth ache, foul odor, nasal drainage. Email:[email or web address]  cell is up to date

## 2019-02-12 ENCOUNTER — Other Ambulatory Visit: Payer: Self-pay

## 2019-02-12 ENCOUNTER — Ambulatory Visit (INDEPENDENT_AMBULATORY_CARE_PROVIDER_SITE_OTHER): Payer: Self-pay | Admitting: Internal Medicine

## 2019-02-12 DIAGNOSIS — J0100 Acute maxillary sinusitis, unspecified: Secondary | ICD-10-CM

## 2019-02-12 MED ORDER — AMOXICILLIN-POT CLAVULANATE 875-125 MG PO TABS: 1.0000 | ORAL_TABLET | Freq: Two times a day (BID) | ORAL | 0 refills | Status: AC

## 2019-02-12 NOTE — Progress Notes (Signed)
Virtual Visit via Video Note  I connected with Carol Smith on 02/12/19 at  9:00 AM EDT by a video enabled telemedicine application and verified that I am speaking with the correct person using two identifiers.  Location patient: home Location provider: work office Persons participating in the virtual visit: patient, provider  I discussed the limitations of evaluation and management by telemedicine and the availability of in person appointments. The patient expressed understanding and agreed to proceed.   HPI: This is an acute visit. She had URI-type symptoms back in March, mainly with runny nose, nasal congestion. No cough, SOB or fever. Acute symptoms have resolved, but she remains with significant right maxillary sinus congestion, ear and tooth pain and post-nasal drip. Secretions are light green in color. She remains afebrile. 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   ROS: Constitutional: Denies fever, chills, diaphoresis, appetite change and fatigue.  HEENT: Denies photophobia, eye pain, redness, hearing loss, mouth sores, trouble swallowing, neck pain, neck stiffness and tinnitus.   Respiratory: Denies SOB, DOE, cough, chest tightness,  and wheezing.   Cardiovascular: Denies chest pain, palpitations and leg swelling.  Gastrointestinal: Denies nausea, vomiting, abdominal pain, diarrhea, constipation, blood in stool and abdominal distention.  Genitourinary: Denies dysuria, urgency, frequency, hematuria, flank pain and difficulty urinating.  Endocrine: Denies: hot or cold intolerance, sweats, changes in hair or nails, polyuria, polydipsia. Musculoskeletal: Denies myalgias, back pain, joint swelling, arthralgias and gait problem.  Skin: Denies pallor, rash and wound.  Neurological: Denies dizziness, seizures, syncope, weakness, light-headedness, numbness and headaches.  Hematological: Denies adenopathy. Easy bruising, personal or family bleeding  history  Psychiatric/Behavioral: Denies suicidal ideation, mood changes, confusion, nervousness, sleep disturbance and agitation   Past Medical History:  Diagnosis Date  . ADD (attention deficit disorder)   . Concussion     Past Surgical History:  Procedure Laterality Date  . ANKLE SURGERY  01/2014  . FOOT SURGERY  01/2014  . TARSAL TUNNEL RELEASE Right     Family History  Problem Relation Age of Onset  . Cancer Other   . Hypertension Other     SOCIAL HX:   reports that she has never smoked. She has never used smokeless tobacco. She reports current alcohol use. She reports that she does not use drugs.   Current Outpatient Medications:  .  amoxicillin-clavulanate (AUGMENTIN) 875-125 MG tablet, Take 1 tablet by mouth 2 (two) times daily for 7 days., Disp: 14 tablet, Rfl: 0 .  Multiple Vitamin (MULTIVITAMIN WITH MINERALS) TABS tablet, Take 1 tablet by mouth daily., Disp: , Rfl:   EXAM:   VITALS per patient if applicable: none reported  GENERAL: alert, oriented, appears well and in no acute distress  HEENT: atraumatic, conjunttiva clear, no obvious abnormalities on inspection of external nose and ears  NECK: normal movements of the head and neck  LUNGS: on inspection no signs of respiratory distress, breathing rate appears normal, no obvious gross increased work of breathing, gasping or wheezing  CV: no obvious cyanosis  MS: moves all visible extremities without noticeable abnormality  PSYCH/NEURO: pleasant and cooperative, no obvious depression or anxiety, speech and thought processing grossly intact  ASSESSMENT AND PLAN:   Acute non-recurrent maxillary sinusitis  -Symptoms sound like possibly bacterial sinusitis. -As congestion, ear and tooth pain have been present for about 6 weeks and there is purulent nasal discharge, will treat empirically with a 7 day course of augmentin. -Also advised mucinex-D BID. -  If she is still in town, she may follow up with us as  needed.    I discussed the assessment and treatment plan with the patient. The patient was provided an opportunity to ask questions and all were answered. The patient agreed with the plan and demonstrated an understanding of the instructions.   The patient was advised to call back or seek an in-person evaluation if the symptoms worsen or if the condition fails to improve as anticipated.    Carol JanEstela Hernandez Acosta, MD  [facility]

## 2019-04-20 ENCOUNTER — Telehealth: Payer: Self-pay | Admitting: Internal Medicine

## 2019-04-20 NOTE — Telephone Encounter (Signed)
LMVM for the patient to contact the office to schedule an appointment.     Copied from [place] [id number]. Topic: Appointment Scheduling - Scheduling Inquiry for Clinic >> Apr 01, 2019  1:10 PM Carol Smith, Carol Smith wrote: Reason for CRM: Patient needs appt for lump on right calf. Someone answered at the practice and di not say anything at all on the line for 2 minutes, please call back to schedule >> Apr 01, 2019  2:42 PM Cox, Melburn Hake, CMA wrote: Pt was scheduled for OV with Jerilee Hoh in May. She is not our patient. She has not seen Dr. Sherren Mocha in many years and chart shows [facility] as PCP. This appt with Jerilee Hoh should not have been scheduled since we are not pt's PCP. Pt will need to establish care with new provider or call her current PCP. It does not count as a TOC since her chart does not show Sherren Mocha as her current PCP.

## 2020-01-16 IMAGING — CT CT RENAL STONE PROTOCOL
2 of 4 series · 16 of 46 positions shown, 18 images · non-contrast
Comparison: CT 09/18/2010

CLINICAL DATA: Flank pain

EXAM:
CT ABDOMEN AND PELVIS WITHOUT CONTRAST
TECHNIQUE: Multidetector CT imaging of the abdomen and pelvis was performed
following the standard protocol without IV contrast.

[Series 3: renal stone 5.0 · axial · 0.92mm/px · z∈[+608,+1024]mm · 13 of 91 slices shown, 15 images]
[im 4/91  soft-tissue]
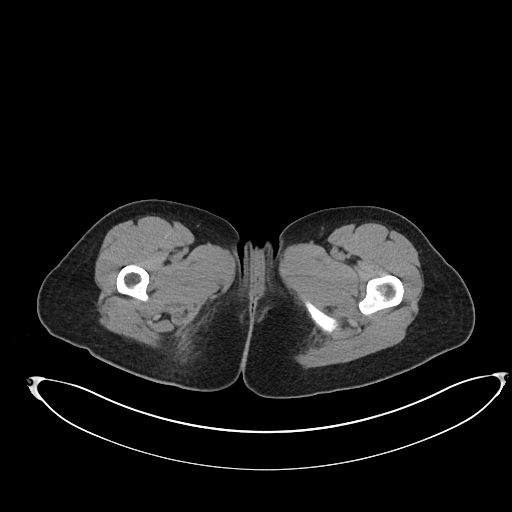
[im 4/91  bone]
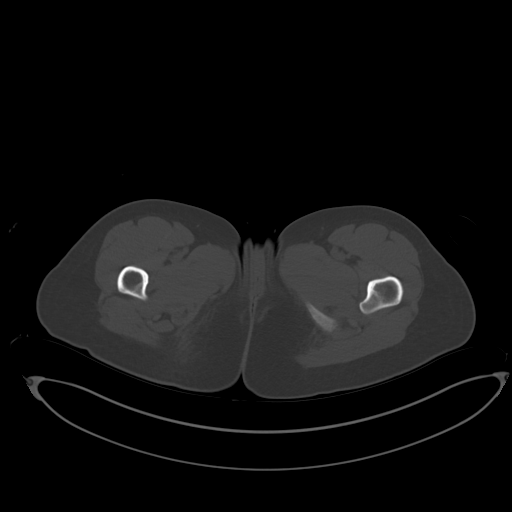
[im 12/91  soft-tissue]
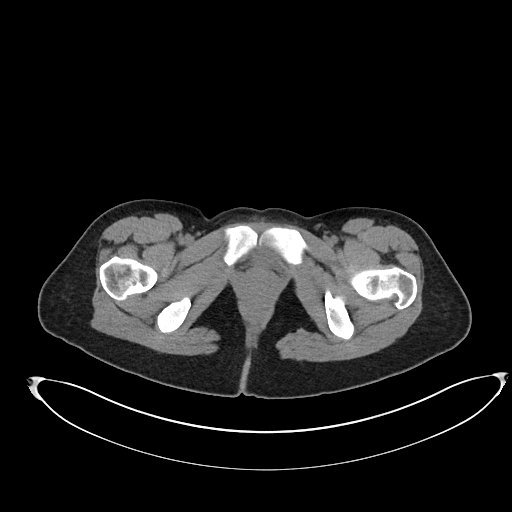
[im 19/91  soft-tissue]
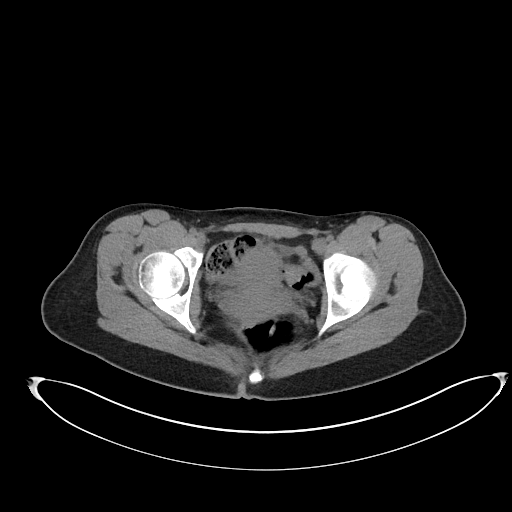
[im 27/91  soft-tissue]
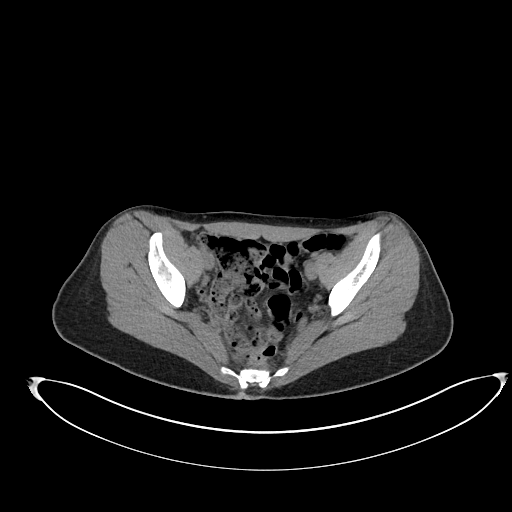
[im 31/91  soft-tissue]
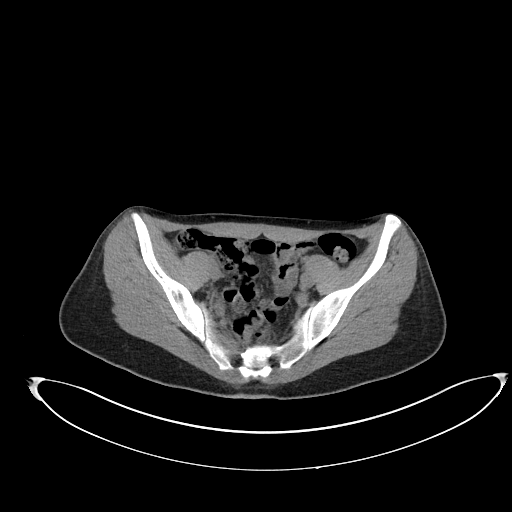
[im 38/91  soft-tissue]
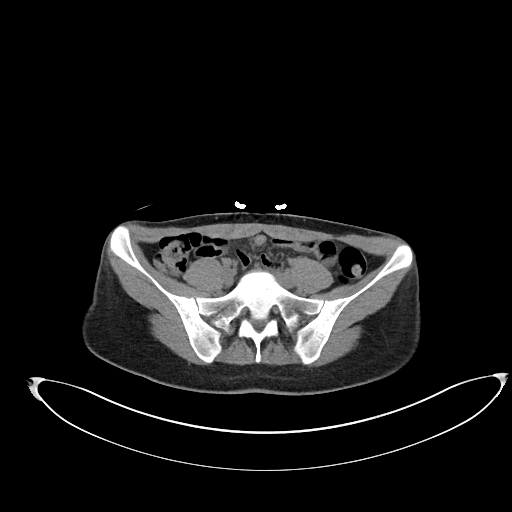
[im 46/91  soft-tissue]
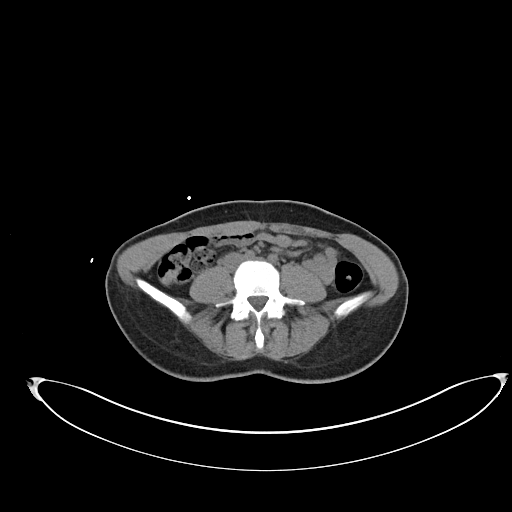
[im 53/91  soft-tissue]
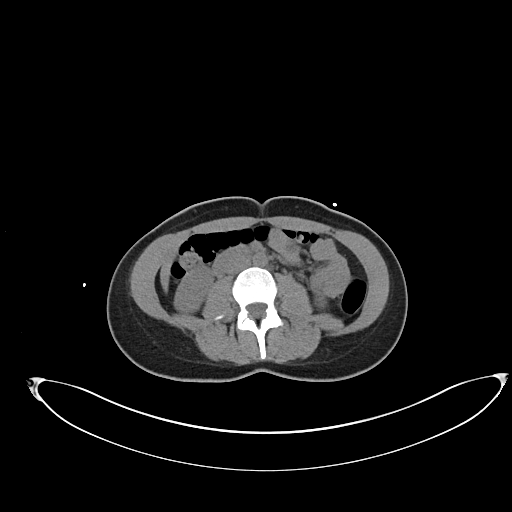
[im 61/91  soft-tissue]
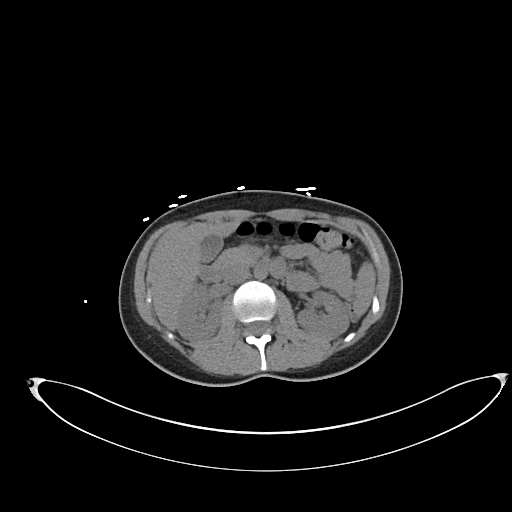
[im 61/91  bone]
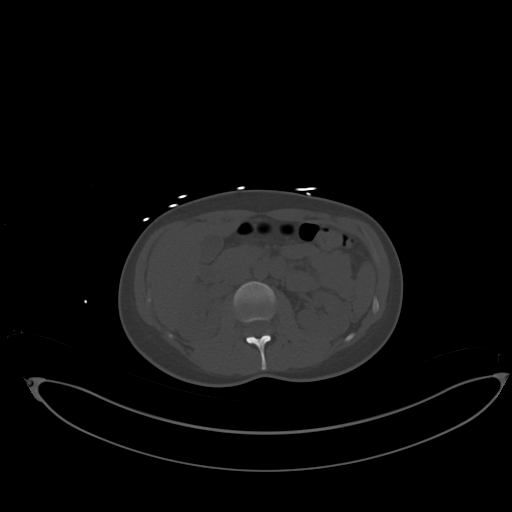
[im 64/91  soft-tissue]
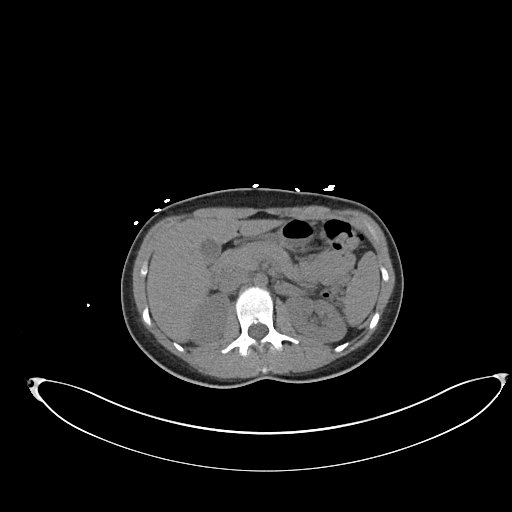
[im 72/91  soft-tissue]
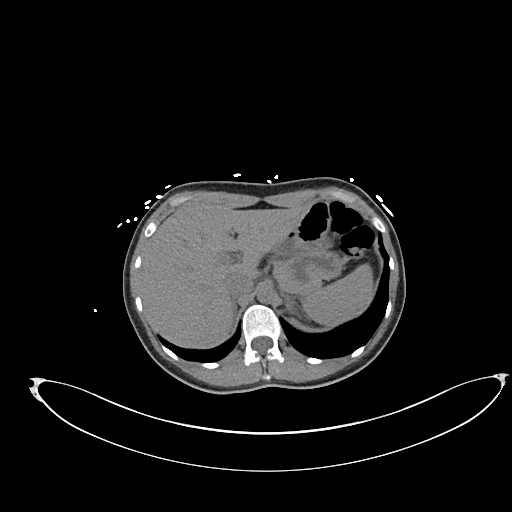
[im 79/91  soft-tissue]
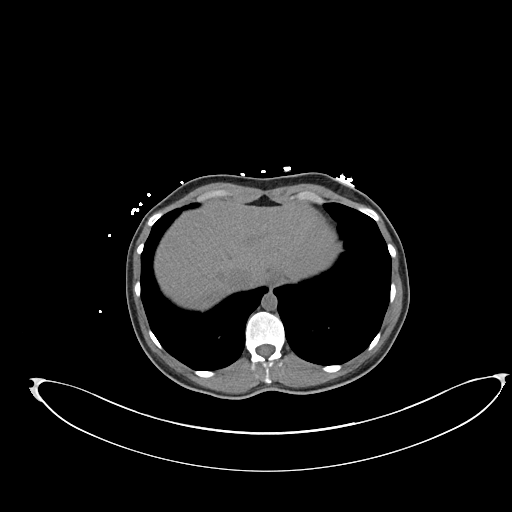
[im 87/91  soft-tissue]
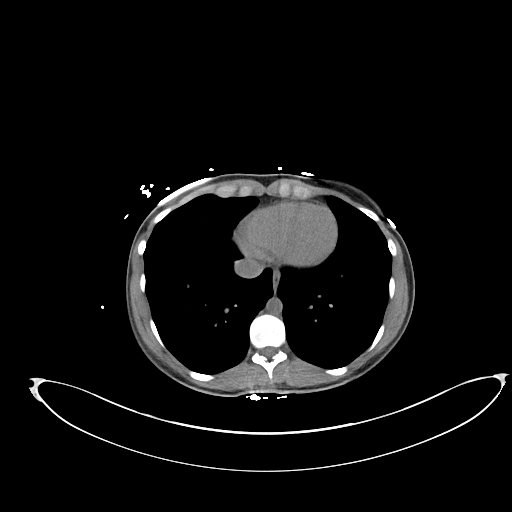

[Series 5: renal stone 3.0 cor · coronal · 0.64mm/px · 3 of 99 slices shown]
[im 33/99  soft-tissue]
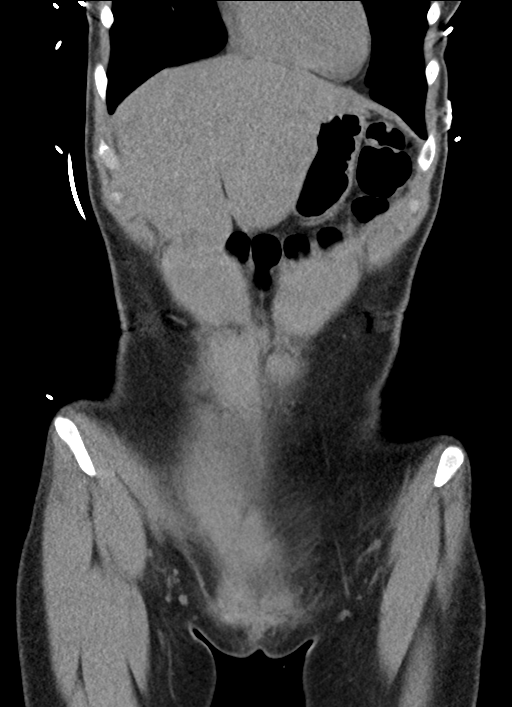
[im 44/99  soft-tissue]
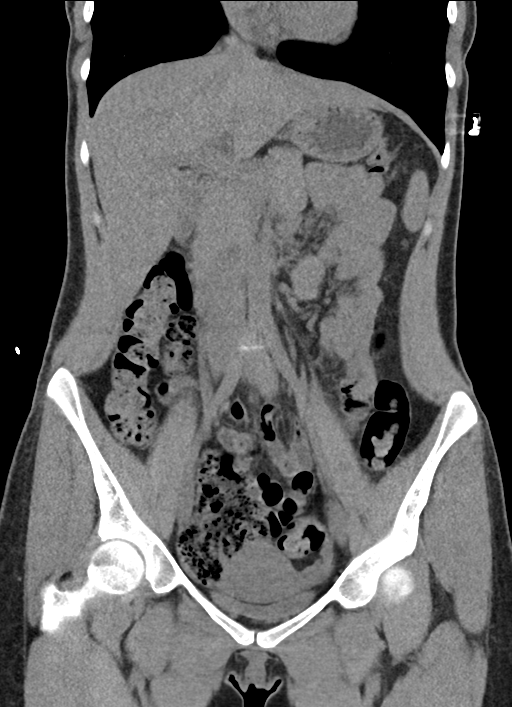
[im 55/99  soft-tissue]
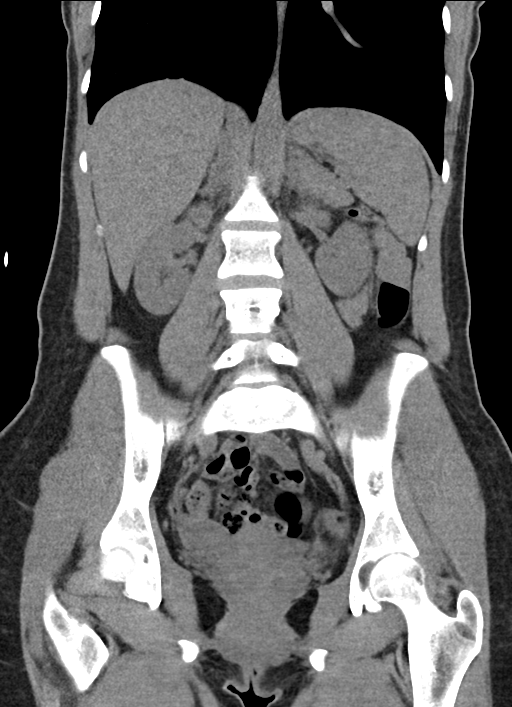

[16 of 46 positions shown; findings below may reference images not displayed]

FINDINGS: Lower chest: No acute abnormality.

Hepatobiliary: No focal liver abnormality is seen. No gallstones,
gallbladder wall thickening, or biliary dilatation.

Pancreas: Unremarkable. No pancreatic ductal dilatation or
surrounding inflammatory changes.

Spleen: Normal in size without focal abnormality.

Adrenals/Urinary Tract: Adrenal glands are unremarkable. Kidneys are
normal, without renal calculi, focal lesion, or hydronephrosis.
Bladder is unremarkable.

Stomach/Bowel: Stomach is within normal limits. Appendix appears
normal. No evidence of bowel wall thickening, distention, or
inflammatory changes.

Vascular/Lymphatic: No significant vascular findings are present. No
enlarged abdominal or pelvic lymph nodes.

Reproductive: Uterus and bilateral adnexa are unremarkable.

Other: Negative for free air or free fluid.

Musculoskeletal: No acute or significant osseous findings.
IMPRESSION: Negative for nephrolithiasis, hydronephrosis or ureteral stone. No
CT evidence for acute intra-abdominal or pelvic abnormality.
